# Patient Record
Sex: Female | Born: 1990 | Hispanic: Yes | Marital: Married | Smoking: Former smoker
Health system: Southern US, Community
[De-identification: ages and names within clinical notes are randomized; demographics above are authoritative.]

## PROBLEM LIST (undated history)

## (undated) DIAGNOSIS — Z789 Other specified health status: Secondary | ICD-10-CM

## (undated) HISTORY — DX: Other specified health status: Z78.9

## (undated) HISTORY — PX: NO PAST SURGERIES: SHX2092

---

## 2018-08-21 ENCOUNTER — Encounter (HOSPITAL_COMMUNITY): Payer: Self-pay

## 2018-08-21 ENCOUNTER — Other Ambulatory Visit: Payer: Self-pay

## 2018-08-21 ENCOUNTER — Emergency Department (HOSPITAL_COMMUNITY)
Admission: EM | Admit: 2018-08-21 | Discharge: 2018-08-21 | Disposition: A | Discharge status: Home / Self Care | Payer: BC Managed Care – PPO | Attending: Emergency Medicine | Admitting: Emergency Medicine

## 2018-08-21 DIAGNOSIS — N3001 Acute cystitis with hematuria: Secondary | ICD-10-CM | POA: Insufficient documentation

## 2018-08-21 DIAGNOSIS — R1032 Left lower quadrant pain: Secondary | ICD-10-CM | POA: Diagnosis not present

## 2018-08-21 DIAGNOSIS — R300 Dysuria: Secondary | ICD-10-CM | POA: Diagnosis not present

## 2018-08-21 DIAGNOSIS — R1030 Lower abdominal pain, unspecified: Secondary | ICD-10-CM | POA: Diagnosis not present

## 2018-08-21 DIAGNOSIS — R1031 Right lower quadrant pain: Secondary | ICD-10-CM | POA: Diagnosis not present

## 2018-08-21 DIAGNOSIS — R319 Hematuria, unspecified: Secondary | ICD-10-CM | POA: Diagnosis present

## 2018-08-21 LAB — COMPREHENSIVE METABOLIC PANEL
ALT: 22 U/L (ref 0–44)
AST: 35 U/L (ref 15–41)
Albumin: 3.8 g/dL (ref 3.5–5.0)
Alkaline Phosphatase: 80 U/L (ref 38–126)
Anion gap: 7 (ref 5–15)
BUN: 11 mg/dL (ref 6–20)
CO2: 26 mmol/L (ref 22–32)
Calcium: 9.2 mg/dL (ref 8.9–10.3)
Chloride: 106 mmol/L (ref 98–111)
Creatinine, Ser: 0.79 mg/dL (ref 0.44–1.00)
GFR calc Af Amer: >60 mL/min (ref >60)
GFR calc non Af Amer: >60 mL/min (ref >60)
Glucose, Bld: 95 mg/dL (ref 70–99)
Potassium: 4.4 mmol/L (ref 3.5–5.1)
Sodium: 139 mmol/L (ref 135–145)
Total Bilirubin: 0.6 mg/dL (ref 0.3–1.2)
Total Protein: 7.1 g/dL (ref 6.5–8.1)

## 2018-08-21 LAB — URINALYSIS, MICROSCOPIC (REFLEX)

## 2018-08-21 LAB — CBC WITH DIFFERENTIAL/PLATELET
Abs Immature Granulocytes: 0.04 10*3/uL (ref 0.00–0.07)
Basophils Absolute: 0.1 10*3/uL (ref 0.0–0.1)
Basophils Relative: 1 %
Eosinophils Absolute: 0.1 10*3/uL (ref 0.0–0.5)
Eosinophils Relative: 1 %
HCT: 39.2 % (ref 36.0–46.0)
Hemoglobin: 12.8 g/dL (ref 12.0–15.0)
Immature Granulocytes: 0 %
Lymphocytes Relative: 20 %
Lymphs Abs: 2.1 10*3/uL (ref 0.7–4.0)
MCH: 30.8 pg (ref 26.0–34.0)
MCHC: 32.7 g/dL (ref 30.0–36.0)
MCV: 94.5 fL (ref 80.0–100.0)
Monocytes Absolute: 0.7 10*3/uL (ref 0.1–1.0)
Monocytes Relative: 6 %
Neutro Abs: 7.8 10*3/uL — ABNORMAL HIGH (ref 1.7–7.7)
Neutrophils Relative %: 72 %
Platelets: 331 10*3/uL (ref 150–400)
RBC: 4.15 MIL/uL (ref 3.87–5.11)
RDW: 11.9 % (ref 11.5–15.5)
WBC: 10.8 10*3/uL — ABNORMAL HIGH (ref 4.0–10.5)
nRBC: 0 % (ref 0.0–0.2)

## 2018-08-21 LAB — URINALYSIS, ROUTINE W REFLEX MICROSCOPIC
Bilirubin Urine: NEGATIVE
Glucose, UA: NEGATIVE mg/dL
Ketones, ur: NEGATIVE mg/dL
Nitrite: POSITIVE — AB
Protein, ur: 30 mg/dL — AB
Specific Gravity, Urine: 1.015 (ref 1.005–1.030)
pH: 6 (ref 5.0–8.0)

## 2018-08-21 LAB — POC URINE PREG, ED: Preg Test, Ur: NEGATIVE

## 2018-08-21 LAB — LIPASE, BLOOD: Lipase: 32 U/L (ref 11–51)

## 2018-08-21 MED ORDER — SULFAMETHOXAZOLE-TRIMETHOPRIM 800-160 MG PO TABS: 1.0000 | ORAL_TABLET | Freq: Two times a day (BID) | ORAL | 0 refills | Status: AC

## 2018-08-21 MED ORDER — PHENAZOPYRIDINE HCL 200 MG PO TABS: 200.0000 mg | ORAL_TABLET | Freq: Three times a day (TID) | ORAL | 0 refills | Status: DC

## 2018-08-21 NOTE — ED Triage Notes (Signed)
Yesterday pt began having some painful urination, blood is present in her urine as well and having some Lt sided flank pain.

## 2018-08-21 NOTE — ED Notes (Signed)
Patient verbalizes understanding of discharge instructions. Opportunity for questioning and answers were provided. Armband removed by staff, pt discharged from ED.  

## 2018-08-21 NOTE — Discharge Instructions (Addendum)

## 2018-08-21 NOTE — ED Provider Notes (Signed)
[facility] EMERGENC[facility] DEPART[facility] Provider Note   CSN: [id number] Arrival date & time: 08/21/18  1004    History   Chief Complaint Chief Complaint  Patient presents with  . Hematuria    HPI Betty Scott is a 28 y.o. female presents emergency department today with chief complaint of hematuria x2 days.  Patient reports associated dysuria and abdominal pain.  Pain is located in suprapubic region and radiates to right and left lower quadrants.  She states the pain is sharp and rates it 7 out of 10 in severity.  Her husband used to work as a Software engineer and gave her an antibiotic for urinary tract infections.  She does not remember the name of the antibiotic.  She denies fever, chills, chest pain, shortness of breath, flank pain, nausea, vomiting, diarrhea.   Due to language barrier, a video interpreter was present during the history-taking and subsequent discussion (and for part of the physical exam) with this patient.   History reviewed. No pertinent past medical history.  There are no active problems to display for this patient.    OB History   No obstetric history on file.      Home Medications    Prior to Admission medications   Medication Sig Start Date End Date Taking? Authorizing Provider  phenazopyridine (PYRIDIUM) 200 MG tablet Take 1 tablet (200 mg total) by mouth 3 (three) times daily. 08/21/18   Albrizze, Kaitlyn E, PA-C  sulfamethoxazole-trimethoprim (BACTRIM DS) 800-160 MG tablet Take 1 tablet by mouth 2 (two) times daily for 14 days. 08/21/18 09/04/18  Albrizze, Harley Hallmark, PA-C    Family History History reviewed. No pertinent family history.  Social History Social History   Tobacco Use  . Smoking status: Not on file  Substance Use Topics  . Alcohol use: Not on file  . Drug use: Not on file     Allergies   Fish allergy and Fish-derived products   Review of Systems Review of Systems  Constitutional: Negative for chills and  fever.  Gastrointestinal: Positive for abdominal pain. Negative for diarrhea, nausea and vomiting.  Genitourinary: Positive for dysuria, flank pain and hematuria. Negative for pelvic pain, vaginal discharge and vaginal pain.  Skin: Negative for wound.  Allergic/Immunologic: Negative for immunocompromised state.     Physical Exam Updated Vital Signs BP 117/73 (BP Location: Right Arm)   Pulse 92   Temp 98.4 F (36.9 C) (Oral)   Resp 16   Ht 5\' 3"  (1.6 m)   Wt 65.8 kg   SpO2 98%   BMI 25.69 kg/m   Physical Exam Vitals signs and nursing note reviewed.  Constitutional:      General: She is not in acute distress.    Appearance: She is not ill-appearing.  HENT:     Head: Normocephalic and atraumatic.     Right Ear: Tympanic membrane and external ear normal.     Left Ear: Tympanic membrane and external ear normal.     Nose: Nose normal.     Mouth/Throat:     Mouth: Mucous membranes are moist.     Pharynx: Oropharynx is clear.  Eyes:     General: No scleral icterus.       Right eye: No discharge.        Left eye: No discharge.     Extraocular Movements: Extraocular movements intact.     Conjunctiva/sclera: Conjunctivae normal.     Pupils: Pupils are equal, round, and reactive to light.  Neck:  Musculoskeletal: Normal range of motion.     Vascular: No JVD.  Cardiovascular:     Rate and Rhythm: Normal rate and regular rhythm.     Pulses: Normal pulses.          Radial pulses are 2+ on the right side and 2+ on the left side.     Heart sounds: Normal heart sounds.  Pulmonary:     Comments: Lungs clear to auscultation in all fields. Symmetric chest rise. No wheezing, rales, or rhonchi. Abdominal:     Tenderness: There is right CVA tenderness. There is no left CVA tenderness.     Comments: Abdomen is soft, non-distended, Tender to palpation of suprapubic area. No rigidity, no guarding. No peritoneal signs.  Musculoskeletal: Normal range of motion.  Skin:    General: Skin  is warm and dry.     Capillary Refill: Capillary refill takes less than 2 seconds.  Neurological:     Mental Status: She is oriented to person, place, and time.     GCS: GCS eye subscore is 4. GCS verbal subscore is 5. GCS motor subscore is 6.     Comments: Fluent speech, no facial droop.  Psychiatric:        Behavior: Behavior normal.      ED Treatments / Results  Labs (all labs ordered are listed, but only abnormal results are displayed) Labs Reviewed  URINALYSIS, ROUTINE W REFLEX MICROSCOPIC - Abnormal; Notable for the following components:      Result Value   Color, Urine GREEN (*)    APPearance HAZY (*)    Hgb urine dipstick MODERATE (*)    Protein, ur 30 (*)    Nitrite POSITIVE (*)    Leukocytes,Ua MODERATE (*)    All other components within normal limits  CBC WITH DIFFERENTIAL/PLATELET - Abnormal; Notable for the following components:   WBC 10.8 (*)    Neutro Abs 7.8 (*)    All other components within normal limits  URINALYSIS, MICROSCOPIC (REFLEX) - Abnormal; Notable for the following components:   Bacteria, UA MANY (*)    All other components within normal limits  URINE CULTURE  COMPREHENSIVE METABOLIC PANEL  LIPASE, BLOOD  POC URINE PREG, ED    EKG None  Radiology No results found.  Procedures Procedures (including critical care time)  Medications Ordered in ED Medications - No data to display   Initial Impression / Assessment and Plan / ED Course  I have reviewed the triage vital signs and the nursing notes.  Pertinent labs & imaging results that were available during my care of the patient were reviewed by me and considered in my medical decision making (see chart for details).   Pt is afebrile, normotensive, overall well appearing.  She has suprapubic tenderness on exam, no peritoneal signs.  Also has mild left CVA tenderness. DDx includes UTI, pyelonephritis, STI, PID.  UA with signs of UTI. CBC with leukocytosis of 10.8, otherwise unremarkable.   CMP and lipase also unremarkable.  Pregnancy test is negative.  Have low suspicion for pyelonephritis given left CVA tenderness and slightly elevated WBC, so will discharge home with Bactrim x2 weeks for possible pyelonephritis. Also discharged with prescription for Pyridium for dysuria.  Evaluation does not show pathology that would require ongoing emergent intervention or inpatient treatment. I explained the diagnosis to the patient.   Patient is comfortable with above plan and is stable for discharge at this time. All questions were answered prior to disposition. Strict return precautions for  returning to the ED were discussed. Encouraged follow up with PCP. Provided patient with a list of clinic resources to use if they do not have a PCP.   This note was prepared using Dragon voice recognition software and may include unintentional dictation errors due to the inherent limitations of voice recognition software.    Final Clinical Impressions(s) / ED Diagnoses   Final diagnoses:  Acute cystitis with hematuria    ED Discharge Orders         Ordered    sulfamethoxazole-trimethoprim (BACTRIM DS) 800-160 MG tablet  2 times daily     08/21/18 1506    phenazopyridine (PYRIDIUM) 200 MG tablet  3 times daily     08/21/18 1506           Albrizze, Middleburg HeightsKaitlyn E, PA-C 08/21/18 1554    Derwood KaplanNanavati, Ankit, MD 08/22/18 1437

## 2018-08-23 LAB — URINE CULTURE: Culture: >=100000 — AB

## 2018-08-24 ENCOUNTER — Telehealth: Payer: Self-pay | Admitting: Emergency Medicine

## 2018-08-24 NOTE — Telephone Encounter (Signed)
Post ED Visit - Positive Culture Follow-up  Culture report reviewed by antimicrobial stewardship pharmacist: [facility] Team []  Elenor Quinones, Pharm.D. []  Heide Guile, Pharm.D., BCPS AQ-ID []  Parks Neptune, Pharm.D., BCPS []  Alycia Rossetti, Pharm.D., BCPS []  [place], Pharm.D., BCPS, AAHIVP []  Legrand Como, Pharm.D., BCPS, AAHIVP []  Salome Arnt, PharmD, BCPS []  Johnnette Gourd, PharmD, BCPS [x]  Hughes Better, PharmD, BCPS []  Leeroy Cha, PharmD []  Laqueta Linden, PharmD, BCPS []  Albertina Parr, PharmD  [facility] Team []  Leodis Sias, PharmD []  Lindell Spar, PharmD []  Royetta Asal, PharmD []  Graylin Shiver, Rph []  Rema Fendt) Glennon Mac, PharmD []  Arlyn Dunning, PharmD []  Netta Cedars, PharmD []  Dia Sitter, PharmD []  Leone Haven, PharmD []  Gretta Arab, PharmD []  Theodis Shove, PharmD []  Peggyann Juba, PharmD []  Reuel Boom, PharmD   Positive urine culture Treated with Sulfamethoxazole-Trimethoprim, organism sensitive to the same and no further patient follow-up is required at this time.  Betty Scott Betty Scott 08/24/2018, 4:16 PM

## 2019-05-14 ENCOUNTER — Emergency Department (HOSPITAL_COMMUNITY): Payer: BC Managed Care – PPO

## 2019-05-14 ENCOUNTER — Emergency Department (HOSPITAL_COMMUNITY)
Admission: EM | Admit: 2019-05-14 | Discharge: 2019-05-14 | Disposition: A | Discharge status: Home / Self Care | Payer: BC Managed Care – PPO | Attending: Emergency Medicine | Admitting: Emergency Medicine

## 2019-05-14 ENCOUNTER — Other Ambulatory Visit: Payer: Self-pay

## 2019-05-14 DIAGNOSIS — R112 Nausea with vomiting, unspecified: Secondary | ICD-10-CM | POA: Insufficient documentation

## 2019-05-14 DIAGNOSIS — R109 Unspecified abdominal pain: Secondary | ICD-10-CM | POA: Diagnosis present

## 2019-05-14 DIAGNOSIS — R1011 Right upper quadrant pain: Secondary | ICD-10-CM | POA: Diagnosis not present

## 2019-05-14 DIAGNOSIS — R1013 Epigastric pain: Secondary | ICD-10-CM | POA: Diagnosis not present

## 2019-05-14 LAB — COMPREHENSIVE METABOLIC PANEL
ALT: 17 U/L (ref 0–44)
AST: 24 U/L (ref 15–41)
Albumin: 3.8 g/dL (ref 3.5–5.0)
Alkaline Phosphatase: 87 U/L (ref 38–126)
Anion gap: 9 (ref 5–15)
BUN: 12 mg/dL (ref 6–20)
CO2: 26 mmol/L (ref 22–32)
Calcium: 9 mg/dL (ref 8.9–10.3)
Chloride: 104 mmol/L (ref 98–111)
Creatinine, Ser: 0.85 mg/dL (ref 0.44–1.00)
GFR calc Af Amer: >60 mL/min (ref >60)
GFR calc non Af Amer: >60 mL/min (ref >60)
Glucose, Bld: 109 mg/dL — ABNORMAL HIGH (ref 70–99)
Potassium: 3.9 mmol/L (ref 3.5–5.1)
Sodium: 139 mmol/L (ref 135–145)
Total Bilirubin: 0.9 mg/dL (ref 0.3–1.2)
Total Protein: 7.7 g/dL (ref 6.5–8.1)

## 2019-05-14 LAB — URINALYSIS, ROUTINE W REFLEX MICROSCOPIC
Bacteria, UA: NONE SEEN
Bilirubin Urine: NEGATIVE
Glucose, UA: NEGATIVE mg/dL
Hgb urine dipstick: NEGATIVE
Ketones, ur: 20 mg/dL — AB
Leukocytes,Ua: NEGATIVE
Nitrite: NEGATIVE
Protein, ur: 30 mg/dL — AB
Specific Gravity, Urine: 1.03 (ref 1.005–1.030)
pH: 6 (ref 5.0–8.0)

## 2019-05-14 LAB — CBC
HCT: 40.3 % (ref 36.0–46.0)
Hemoglobin: 12.8 g/dL (ref 12.0–15.0)
MCH: 29.8 pg (ref 26.0–34.0)
MCHC: 31.8 g/dL (ref 30.0–36.0)
MCV: 93.7 fL (ref 80.0–100.0)
Platelets: 378 10*3/uL (ref 150–400)
RBC: 4.3 MIL/uL (ref 3.87–5.11)
RDW: 12.4 % (ref 11.5–15.5)
WBC: 8.6 10*3/uL (ref 4.0–10.5)
nRBC: 0 % (ref 0.0–0.2)

## 2019-05-14 LAB — I-STAT BETA HCG BLOOD, ED (MC, WL, AP ONLY): I-stat hCG, quantitative: <5 m[IU]/mL (ref <5)

## 2019-05-14 LAB — LIPASE, BLOOD: Lipase: 25 U/L (ref 11–51)

## 2019-05-14 MED ORDER — ONDANSETRON HCL 4 MG PO TABS: 4.0000 mg | ORAL_TABLET | Freq: Four times a day (QID) | ORAL | 0 refills | Status: DC

## 2019-05-14 MED ORDER — SODIUM CHLORIDE 0.9% FLUSH
3.0000 mL | Freq: Once | INTRAVENOUS | Status: AC
  Administered 2019-05-14: 3 mL via INTRAVENOUS

## 2019-05-14 MED ORDER — ONDANSETRON HCL 4 MG/2ML IJ SOLN
4.0000 mg | Freq: Once | INTRAMUSCULAR | Status: AC
  Administered 2019-05-14: 4 mg via INTRAVENOUS
  Filled 2019-05-14: qty 2

## 2019-05-14 MED ORDER — SODIUM CHLORIDE 0.9 % IV BOLUS (SEPSIS)
1000.0000 mL | Freq: Once | INTRAVENOUS | Status: AC
  Administered 2019-05-14: 1000 mL via INTRAVENOUS

## 2019-05-14 NOTE — ED Triage Notes (Signed)
Sent from urgent care for abd pain-- has been throwing up recently- 10 times yesterday green in color. No fever. No diarrhea.

## 2019-05-14 NOTE — ED Provider Notes (Signed)
[facility] EMERGENCY DEPARTMENT Provider Note   CSN: [id number] Arrival date & time: 05/14/19  1513     History Chief Complaint  Patient presents with  . Abdominal Pain    Betty Scott is a 29 y.o. female.  Patient is a 29 year old female with no past medical history presenting to the emergency department for epigastric abdominal pain.  Patient reports that it started yesterday and she has had multiple episodes of nausea, vomiting and is having anorexia.  She saw urgent care today who sent her here for further evaluation.  Patient reports the pain is mild.  She has not had anything to eat since yesterday.  Denies any dysuria or diarrhea or fever or chills or cough or URI symptoms.  the pain is exacerbated by eating        No past medical history on file.  There are no problems to display for this patient.      OB History   No obstetric history on file.     No family history on file.  Social History   Tobacco Use  . Smoking status: Not on file  Substance Use Topics  . Alcohol use: Not on file  . Drug use: Not on file    Home Medications Prior to Admission medications   Not on File    Allergies    Patient has no allergy information on record.  Review of Systems   Review of Systems  Constitutional: Positive for appetite change. Negative for chills and fever.  HENT: Negative for congestion and sore throat.   Eyes: Negative for visual disturbance.  Respiratory: Negative for cough and shortness of breath.   Cardiovascular: Negative for chest pain.  Gastrointestinal: Positive for abdominal pain, nausea and vomiting. Negative for abdominal distention and diarrhea.  Genitourinary: Negative for dysuria.  Musculoskeletal: Negative for back pain.  Skin: Negative for rash.  Allergic/Immunologic: Negative for immunocompromised state.  Neurological: Negative for dizziness.    Physical Exam Updated Vital Signs BP 108/77 (BP Location:  Left Arm)   Pulse 85   Temp 98.5 F (36.9 C) (Oral)   Resp 16   LMP 05/13/2019   SpO2 100%   Physical Exam Vitals and nursing note reviewed.  Constitutional:      General: She is not in acute distress.    Appearance: Normal appearance. She is well-developed. She is not ill-appearing, toxic-appearing or diaphoretic.  HENT:     Head: Normocephalic.     Mouth/Throat:     Mouth: Mucous membranes are moist.  Eyes:     Conjunctiva/sclera: Conjunctivae normal.  Cardiovascular:     Rate and Rhythm: Normal rate and regular rhythm.  Pulmonary:     Effort: Pulmonary effort is normal.  Abdominal:     General: Abdomen is flat. Bowel sounds are normal.     Palpations: Abdomen is soft. There is no shifting dullness.     Tenderness: There is abdominal tenderness in the epigastric area. There is no right CVA tenderness, guarding or rebound. Negative signs include Murphy's sign, Rovsing's sign, McBurney's sign and psoas sign.  Skin:    General: Skin is dry.  Neurological:     Mental Status: She is alert.  Psychiatric:        Mood and Affect: Mood normal.     ED Results / Procedures / Treatments   Labs (all labs ordered are listed, but only abnormal results are displayed) Labs Reviewed  COMPREHENSIVE METABOLIC PANEL - Abnormal; Notable for the following  components:      Result Value   Glucose, Bld 109 (*)    All other components within normal limits  LIPASE, BLOOD  CBC  URINALYSIS, ROUTINE W REFLEX MICROSCOPIC  I-STAT BETA HCG BLOOD, ED ([facility], [facility], [facility] ONLY)    EKG None  Radiology No results found.  Procedures Procedures (including critical care time)  Medications Ordered in ED Medications  sodium chloride flush (NS) 0.9 % injection 3 mL (has no administration in time range)  sodium chloride 0.9 % bolus 1,000 mL (has no administration in time range)  ondansetron (ZOFRAN) injection 4 mg (has no administration in time range)    ED Course  I have reviewed the triage vital  signs and the nursing notes.  Pertinent labs & imaging results that were available during my care of the patient were reviewed by me and considered in my medical decision making (see chart for details).  Clinical Course as of May 14 2131  Wed May 14, 2019  2005 Well-appearing female presenting to the emergency department sent by urgent care for for further evaluation of epigastric pain.  Vitals and labs are reassuring.  A right upper quadrant ultrasound was performed.   [KM]  2117 Right upper quadrant ultrasound was reassuring.  There is a gallbladder polyp.  This was explained using the interpreter with the patient.  It is advised that she have a follow-up ultrasound in 5 months if she is still having symptoms.  She is otherwise feeling well and stable for discharge.  Likely viral etiology of her symptoms.  She will be sent home with Zofran.   [KM]    Clinical Course User Index [KM] Kristine Royal   MDM Rules/Calculators/A&P                      Based on review of vitals, medical screening exam, lab work and/or imaging, there does not appear to be an acute, emergent etiology for the patient's symptoms. Counseled pt on good return precautions and encouraged both PCP and ED follow-up as needed.  Prior to discharge, I also discussed incidental imaging findings with patient in detail and advised appropriate, recommended follow-up in detail.  Clinical Impression: 1. Epigastric pain   2. RUQ pain     Disposition: Discharge  Prior to providing a prescription for a controlled substance, I independently reviewed the patient's recent prescription history on the [place]. The patient had no recent or regular prescriptions and was deemed appropriate for a brief, less than 3 day prescription of narcotic for acute analgesia.  This note was prepared with assistance of Systems analyst. Occasional wrong-word or sound-a-like  substitutions may have occurred due to the inherent limitations of voice recognition software.  Final Clinical Impression(s) / ED Diagnoses Final diagnoses:  RUQ pain    Rx / DC Orders ED Discharge Orders    None       Kristine Royal 05/14/19 2133    Daleen Bo, MD 05/14/19 2337

## 2019-05-14 NOTE — ED Notes (Signed)
Kelly PA at bedside   

## 2019-05-14 NOTE — Discharge Instructions (Signed)
You are seen today for abdominal pain.  Your work-up is reassuring.  There was a small gallbladder polyp on your ultrasound.  It is recommended that if you are still having some stomach issues and to follow-up with your primary care doctor and have this ultrasound repeated in 5 months.  You think you may have a viral infection.  Please stay home until you are feeling well and drink plenty of fluids.

## 2020-05-18 ENCOUNTER — Other Ambulatory Visit: Payer: Self-pay

## 2020-05-18 ENCOUNTER — Encounter (HOSPITAL_COMMUNITY): Payer: Self-pay | Admitting: Emergency Medicine

## 2020-05-18 ENCOUNTER — Emergency Department (HOSPITAL_COMMUNITY)
Admission: EM | Admit: 2020-05-18 | Discharge: 2020-05-19 | Discharge status: Against Medical Advice | Payer: BC Managed Care – PPO | Attending: Emergency Medicine | Admitting: Emergency Medicine

## 2020-05-18 DIAGNOSIS — Z5321 Procedure and treatment not carried out due to patient leaving prior to being seen by health care provider: Secondary | ICD-10-CM | POA: Insufficient documentation

## 2020-05-18 DIAGNOSIS — J00 Acute nasopharyngitis [common cold]: Secondary | ICD-10-CM | POA: Insufficient documentation

## 2020-05-18 DIAGNOSIS — R519 Headache, unspecified: Secondary | ICD-10-CM | POA: Insufficient documentation

## 2020-05-18 DIAGNOSIS — Z20822 Contact with and (suspected) exposure to covid-19: Secondary | ICD-10-CM | POA: Insufficient documentation

## 2020-05-18 LAB — RESP PANEL BY RT-PCR (FLU A&B, COVID) ARPGX2
Influenza A by PCR: NEGATIVE
Influenza B by PCR: NEGATIVE
SARS Coronavirus 2 by RT PCR: NEGATIVE

## 2020-05-18 NOTE — ED Triage Notes (Signed)
Emergency Medicine Provider Triage Evaluation Note  Genessis Flanary , a 30 y.o. female  was evaluated in triage.  Pt complains of cold sxs.  Review of Systems  Positive: Myalgias, cough, sob, cp Negative: Fever, headache, n/v/d  Physical Exam  BP 104/76 (BP Location: Right Arm)   Pulse 93   Temp 98.7 F (37.1 C) (Oral)   Resp 16   SpO2 99%  Gen:   Awake, no distress   HEENT:  Atraumatic  Resp:  Normal effort  Cardiac:  Normal rate  Abd:   Nondistended, nontender  MSK:   Moves extremities without difficulty  Neuro:  Speech clear   Medical Decision Making  Medically screening exam initiated at 2:12 PM.  Appropriate orders placed.  Rikia Knighton was informed that the remainder of the evaluation will be completed by another provider, this initial triage assessment does not replace that evaluation, and the importance of remaining in the ED until their evaluation is complete.  Clinical Impression  Pt has been vaccinated for covid-19 here with cold sxs.  Well appearing, no acute resp discomfort.    Fayrene Helper, PA-C 05/18/20 1413

## 2020-05-18 NOTE — ED Notes (Signed)
Pt asked could she go outside for a few minuets through a interpreter on her phone. Pt not answering to be roomed.

## 2020-05-18 NOTE — ED Triage Notes (Addendum)
Pt coming from home, compliant of generalized body aches that started last time. Pt states this has happened before but unsure of cause.

## 2020-08-04 ENCOUNTER — Encounter: Payer: Self-pay | Admitting: Emergency Medicine

## 2020-08-04 ENCOUNTER — Ambulatory Visit
Admission: EM | Admit: 2020-08-04 | Discharge: 2020-08-04 | Disposition: A | Discharge status: Home / Self Care | Payer: Self-pay

## 2020-08-04 DIAGNOSIS — R0789 Other chest pain: Secondary | ICD-10-CM

## 2020-08-04 MED ORDER — NAPROXEN 500 MG PO TABS: 500.0000 mg | ORAL_TABLET | Freq: Two times a day (BID) | ORAL | 0 refills | Status: DC

## 2020-08-04 MED ORDER — TIZANIDINE HCL 4 MG PO TABS: 4.0000 mg | ORAL_TABLET | Freq: Four times a day (QID) | ORAL | 0 refills | Status: DC | PRN

## 2020-08-04 NOTE — ED Provider Notes (Signed)
[facility]   MRN: [id number] DOB: [birth year]  Subjective:   Betty Lepkowski is a 30 y.o. female presenting for 3-day history of acute onset mid to right-sided chest pain.  Patient states that the symptoms occur randomly and are moderate to severe nature.  Denies any heart racing, palpitations, shortness of breath, wheezing, nausea, vomiting, abdominal pain, history of heart disorders.  Patient also has intermittent mild headaches, has a history of migraines.  She is actually undergoing a lot of stress with her husband and reports that she has been very anxious lately.  She is wants to make sure that her heart is okay.  Does not believe that she has COVID-19 test.  No history of thyroid disorders, endocrine disorders.  No current facility-administered medications for this encounter.  Current Outpatient Medications:    acetaminophen (TYLENOL) 500 MG tablet, Take 500 mg by mouth every 6 (six) hours as needed., Disp: , Rfl:    ondansetron (ZOFRAN) 4 MG tablet, Take 1 tablet (4 mg total) by mouth every 6 (six) hours., Disp: 12 tablet, Rfl: 0   Allergies  Allergen Reactions   Fish Allergy     Rash     History reviewed. No pertinent past medical history.   History reviewed. No pertinent surgical history.  History reviewed. No pertinent family history.  Social History   Tobacco Use   Smoking status: Some Days    Pack years: 0.00    Types: Cigarettes   Smokeless tobacco: Never  Substance Use Topics   Alcohol use: Never   Drug use: Never    ROS   Objective:   Vitals: BP 108/76 (BP Location: Right Arm)   Pulse (!) 101   Temp 98.6 F (37 C) (Oral)   Resp 14   LMP 07/19/2020 (Exact Date)   SpO2 97%   Physical Exam Constitutional:      General: She is not in acute distress.    Appearance: Normal appearance. She is well-developed. She is not ill-appearing, toxic-appearing or diaphoretic.  HENT:     Head: Normocephalic and atraumatic.     Nose: Nose  normal.     Mouth/Throat:     Mouth: Mucous membranes are moist.  Eyes:     Extraocular Movements: Extraocular movements intact.     Pupils: Pupils are equal, round, and reactive to light.  Cardiovascular:     Rate and Rhythm: Normal rate and regular rhythm.     Pulses: Normal pulses.     Heart sounds: Normal heart sounds. No murmur heard.   No friction rub. No gallop.  Pulmonary:     Effort: Pulmonary effort is normal. No respiratory distress.     Breath sounds: Normal breath sounds. No stridor. No wheezing, rhonchi or rales.     Comments: No bony defect or pectus excavatum.  Chest:     Chest wall: Tenderness (mild over mid-upper sternum) present.  Skin:    General: Skin is warm and dry.     Findings: No rash.  Neurological:     Mental Status: She is alert and oriented to person, place, and time.  Psychiatric:        Mood and Affect: Mood normal.        Behavior: Behavior normal.        Thought Content: Thought content normal.    ED ECG REPORT   Date: 08/04/2020  EKG Time: 5:07 PM  Rate: 79bpm  Rhythm: normal sinus rhythm,  normal EKG, normal sinus rhythm, there are no  previous tracings available for comparison  Axis: Normal  Intervals:none  ST&T Change: None  Narrative Interpretation: Sinus rhythm at 79 bpm, no previous EKG available for comparison.  Assessment and Plan :   PDMP not reviewed this encounter.  1. Atypical chest pain     Recommended management for musculoskeletal chest wall pain of unknown etiology.  Use naproxen, tizanidine.  Counseled on general management including limiting her lifting and stressors as it is also possible that this chest pain is anxiety related.  Recommended establishing care and following up with a new PCP, information provided to her for this. Counseled patient on potential for adverse effects with medications prescribed/recommended today, ER and return-to-clinic precautions discussed, patient verbalized understanding.    Wallis Bamberg, PA-C 08/04/20 1709

## 2020-08-04 NOTE — ED Triage Notes (Signed)
Central/right sided chest pain starting three days prior. Denies dizziness, coughing, sore throat, nausea, vomiting. Also c/o headache, but states she has a history of migraines

## 2020-10-08 ENCOUNTER — Ambulatory Visit: Payer: Self-pay | Admitting: *Deleted

## 2020-10-08 NOTE — Telephone Encounter (Signed)
Returned call to patient using Spanish interpreter [employer] ID# [id number]. Patient calling with complaints of a migraine. Patient rates pain at 8 and denies other symptoms at this time. Patient states she has a history of migraines and has taken acetaminophen and naprosyn today with not relief. Patient does not have a PCP currently. Patient advised to be seen at [facility] or ED for current symptoms. Patient verbalized understanding and states she will have someone to drive her to seek treatment.  Reason for Disposition  [1] SEVERE headache (e.g., excruciating) AND [2] not improved after 2 hours of pain medicine  Answer Assessment - Initial Assessment Questions 1. LOCATION: "Where does it hurt?"      The whole head 2. ONSET: "When did the headache start?" (Minutes, hours or days)      yesterday 3. PATTERN: "Does the pain come and go, or has it been constant since it started?"    constant 4. SEVERITY: "How bad is the pain?" and "What does it keep you from doing?"  (e.g., Scale 1-10; mild, moderate, or severe)   - MILD (1-3): doesn't interfere with normal activities    - MODERATE (4-7): interferes with normal activities or awakens from sleep    - SEVERE (8-10): excruciating pain, unable to do any normal activities        8 5. RECURRENT SYMPTOM: "Have you ever had headaches before?" If Yes, ask: "When was the last time?" and "What happened that time?"      Patient states she had a history of migraines 6. CAUSE: "What do you think is causing the headache?"     Patient states she has a history of migraines 7. MIGRAINE: "Have you been diagnosed with migraine headaches?" If Yes, ask: "Is this headache similar?"      Yes. At 7 am patient states that she took a pill,Acetaminophen. She states that she did not call before because she did not have a plan 8. HEAD INJURY: "Has there been any recent injury to the head?"      No 9. OTHER SYMPTOMS: "Do you have any other symptoms?" (fever, stiff neck, eye pain, sore throat,  cold symptoms)     No 10. PREGNANCY: "Is there any chance you are pregnant?" "When was your last menstrual period?"      N/a  Protocols used: Headache-A-AH

## 2020-12-09 ENCOUNTER — Emergency Department (HOSPITAL_COMMUNITY)
Admission: EM | Admit: 2020-12-09 | Discharge: 2020-12-10 | Disposition: A | Discharge status: Home / Self Care | Payer: Medicaid Other | Attending: Emergency Medicine | Admitting: Emergency Medicine

## 2020-12-09 ENCOUNTER — Other Ambulatory Visit: Payer: Self-pay

## 2020-12-09 ENCOUNTER — Emergency Department (HOSPITAL_COMMUNITY): Payer: Medicaid Other

## 2020-12-09 ENCOUNTER — Encounter (HOSPITAL_COMMUNITY): Payer: Self-pay | Admitting: Oncology

## 2020-12-09 DIAGNOSIS — Z3A01 Less than 8 weeks gestation of pregnancy: Secondary | ICD-10-CM

## 2020-12-09 DIAGNOSIS — O99355 Diseases of the nervous system complicating the puerperium: Secondary | ICD-10-CM | POA: Insufficient documentation

## 2020-12-09 DIAGNOSIS — R102 Pelvic and perineal pain: Secondary | ICD-10-CM | POA: Insufficient documentation

## 2020-12-09 DIAGNOSIS — G43909 Migraine, unspecified, not intractable, without status migrainosus: Secondary | ICD-10-CM | POA: Insufficient documentation

## 2020-12-09 DIAGNOSIS — O99331 Smoking (tobacco) complicating pregnancy, first trimester: Secondary | ICD-10-CM | POA: Diagnosis not present

## 2020-12-09 DIAGNOSIS — F17210 Nicotine dependence, cigarettes, uncomplicated: Secondary | ICD-10-CM | POA: Insufficient documentation

## 2020-12-09 LAB — BASIC METABOLIC PANEL
Anion gap: 6 (ref 5–15)
BUN: 14 mg/dL (ref 6–20)
CO2: 26 mmol/L (ref 22–32)
Calcium: 9.1 mg/dL (ref 8.9–10.3)
Chloride: 105 mmol/L (ref 98–111)
Creatinine, Ser: 0.76 mg/dL (ref 0.44–1.00)
GFR, Estimated: >60 mL/min (ref >60)
Glucose, Bld: 100 mg/dL — ABNORMAL HIGH (ref 70–99)
Potassium: 3.8 mmol/L (ref 3.5–5.1)
Sodium: 137 mmol/L (ref 135–145)

## 2020-12-09 LAB — CBC WITH DIFFERENTIAL/PLATELET
Abs Immature Granulocytes: 0.05 10*3/uL (ref 0.00–0.07)
Basophils Absolute: 0.1 10*3/uL (ref 0.0–0.1)
Basophils Relative: 0 %
Eosinophils Absolute: 0.1 10*3/uL (ref 0.0–0.5)
Eosinophils Relative: 1 %
HCT: 37.7 % (ref 36.0–46.0)
Hemoglobin: 12.4 g/dL (ref 12.0–15.0)
Immature Granulocytes: 0 %
Lymphocytes Relative: 17 %
Lymphs Abs: 2.1 10*3/uL (ref 0.7–4.0)
MCH: 31 pg (ref 26.0–34.0)
MCHC: 32.9 g/dL (ref 30.0–36.0)
MCV: 94.3 fL (ref 80.0–100.0)
Monocytes Absolute: 0.8 10*3/uL (ref 0.1–1.0)
Monocytes Relative: 6 %
Neutro Abs: 9.9 10*3/uL — ABNORMAL HIGH (ref 1.7–7.7)
Neutrophils Relative %: 76 %
Platelets: 397 10*3/uL (ref 150–400)
RBC: 4 MIL/uL (ref 3.87–5.11)
RDW: 12.3 % (ref 11.5–15.5)
WBC: 13 10*3/uL — ABNORMAL HIGH (ref 4.0–10.5)
nRBC: 0 % (ref 0.0–0.2)

## 2020-12-09 LAB — I-STAT BETA HCG BLOOD, ED (MC, WL, AP ONLY): I-stat hCG, quantitative: 1220.5 m[IU]/mL — ABNORMAL HIGH (ref <5)

## 2020-12-09 LAB — HCG, QUANTITATIVE, PREGNANCY: hCG, Beta Chain, Quant, S: 1970 m[IU]/mL — ABNORMAL HIGH (ref <5)

## 2020-12-09 NOTE — ED Provider Notes (Signed)
Emergency Medicine Provider Triage Evaluation Note  Betty Scott , a 30 y.o. female  was evaluated in triage.  Pt complains of headache and possible pregnancy.  Patient reports her last menstrual cycle was a month ago when she has had 2 positive home pregnancy tests.  She reports that she has been having some intermittent left lower quadrant abdominal pain that has been worsening today but denies vaginal bleeding.  No nausea or vomiting.  Reports that she has a history of migraines, and has been having frequent headaches over the past 5 years, today has been having persistent headache over the past 3 days with associated photophobia and she reports that Tylenol is not helping with the symptoms.  No associated numbness or weakness.  Feels exactly like her prior headaches that she has also had outside of pregnancy.  Review of Systems  Positive: Headache, photophobia, lower abdominal pain Negative: Fever, vomiting, numbness, weakness  Physical Exam  BP 120/81 (BP Location: Left Arm)   Pulse 92   Temp 98.4 F (36.9 C) (Oral)   Resp 17   Ht 5\' 4"  (1.626 m)   Wt 56.7 kg   LMP 09/26/2020 (Exact Date)   SpO2 100%   BMI 21.46 kg/m  Gen:   Awake, no distress   Resp:  Normal effort  MSK:   Moves extremities without difficulty  Other:  Left lower quadrant tenderness  Medical Decision Making  Medically screening exam initiated at 6:25 PM.  Appropriate orders placed.  Betty Scott was informed that the remainder of the evaluation will be completed by another provider, this initial triage assessment does not replace that evaluation, and the importance of remaining in the ED until their evaluation is complete.  Headache without red flag symptoms, similar to headache she has had outside of pregnancy lower suspicion for venous sinus thrombosis since this feels like her typical headaches.  No focal neurologic deficits.  Wanting to confirm pregnancy but has also been having persistent and  worsening left lower quadrant abdominal pain without vaginal bleeding.  We will get lab work and ultrasound to rule out ectopic   11/26/2020 12/09/20 1827    12/11/20, MD 12/10/20 [phone]

## 2020-12-09 NOTE — ED Triage Notes (Signed)
Pt presents d/t migraine x 3 days.  +photophobia.  Pt also reports no menstrual cycle x 1 month.  2 positive pregnancy tests at home.

## 2020-12-10 MED ORDER — ACETAMINOPHEN 500 MG PO TABS
1000.0000 mg | ORAL_TABLET | Freq: Once | ORAL | Status: AC
  Administered 2020-12-10: 1000 mg via ORAL
  Filled 2020-12-10: qty 2

## 2020-12-10 MED ORDER — DIPHENHYDRAMINE HCL 25 MG PO CAPS
25.0000 mg | ORAL_CAPSULE | Freq: Once | ORAL | Status: AC
  Administered 2020-12-10: 25 mg via ORAL
  Filled 2020-12-10: qty 1

## 2020-12-10 MED ORDER — METOCLOPRAMIDE HCL 5 MG/ML IJ SOLN
10.0000 mg | Freq: Once | INTRAMUSCULAR | Status: AC
  Administered 2020-12-10: 10 mg via INTRAMUSCULAR
  Filled 2020-12-10: qty 2

## 2020-12-10 NOTE — ED Provider Notes (Signed)
[facility]-EMERGENCY DEPT Provider Note   CSN: [id number] Arrival date & time: 12/09/20  1727     History Chief Complaint  Patient presents with   Migraine    Betty Scott is a 30 y.o. female.  Patient presents to the emergency department with multiple complaints.  Patient reports that she has a history of frequent migraines.  She has had them for many years.  Patient has been having a headache with light sensitivity for the last 5 days.  She normally uses a medication for her headaches that she got when she was living in [place]  She is not sure what the medicine is.  She has not tried medicine for this particular headache.  Patient reports that this headache is identical to multiple migraine she has had in the past.  There are no unusual features that would suggest a different type of headache.  She does not have any unilateral symptoms, speech difficulty, neck stiffness, fever.  Patient reports that she has been having some intermittent cramping in the left lower abdomen and pelvic region.  No vaginal bleeding or discharge.  Patient reports that her last menstrual period was September 4, has had 2 home pregnancy tests that are positive since then.      History reviewed. No pertinent past medical history.  There are no problems to display for this patient.   History reviewed. No pertinent surgical history.   OB History     Gravida  1   Para      Term      Preterm      AB      Living         SAB      IAB      Ectopic      Multiple      Live Births              No family history on file.  Social History   Tobacco Use   Smoking status: Some Days    Types: Cigarettes   Smokeless tobacco: Never  Substance Use Topics   Alcohol use: Never   Drug use: Never    Home Medications Prior to Admission medications   Medication Sig Start Date End Date Taking? Authorizing Provider  acetaminophen (TYLENOL) 500 MG tablet  Take 500 mg by mouth every 6 (six) hours as needed.    [provider]  naproxen (NAPROSYN) 500 MG tablet Take 1 tablet (500 mg total) by mouth 2 (two) times daily with a meal. 08/04/20   Wallis Bamberg, PA-C  ondansetron (ZOFRAN) 4 MG tablet Take 1 tablet (4 mg total) by mouth every 6 (six) hours. 05/14/19   Arlyn Dunning, PA-C  tiZANidine (ZANAFLEX) 4 MG tablet Take 1 tablet (4 mg total) by mouth every 6 (six) hours as needed for muscle spasms. 08/04/20   Wallis Bamberg, PA-C    Allergies    Fish allergy  Review of Systems   Review of Systems  Genitourinary:  Positive for pelvic pain. Negative for dysuria.  Neurological:  Positive for headaches.  All other systems reviewed and are negative.  Physical Exam Updated Vital Signs BP 110/68 (BP Location: Right Arm)   Pulse 82   Temp 98.6 F (37 C) (Oral)   Resp 17   Ht 5\' 4"  (1.626 m)   Wt 56.7 kg   LMP 09/26/2020 (Exact Date)   SpO2 99%   BMI 21.46 kg/m   Physical Exam Vitals and  nursing note reviewed.  Constitutional:      General: She is not in acute distress.    Appearance: Normal appearance. She is well-developed.  HENT:     Head: Normocephalic and atraumatic.     Right Ear: Hearing normal.     Left Ear: Hearing normal.     Nose: Nose normal.  Eyes:     Conjunctiva/sclera: Conjunctivae normal.     Pupils: Pupils are equal, round, and reactive to light.  Cardiovascular:     Rate and Rhythm: Regular rhythm.     Heart sounds: S1 normal and S2 normal. No murmur heard.   No friction rub. No gallop.  Pulmonary:     Effort: Pulmonary effort is normal. No respiratory distress.     Breath sounds: Normal breath sounds.  Chest:     Chest wall: No tenderness.  Abdominal:     General: Bowel sounds are normal.     Palpations: Abdomen is soft.     Tenderness: There is no abdominal tenderness. There is no guarding or rebound. Negative signs include Murphy's sign and McBurney's sign.     Hernia: No hernia is present.   Musculoskeletal:        General: Normal range of motion.     Cervical back: Normal range of motion and neck supple.  Skin:    General: Skin is warm and dry.     Findings: No rash.  Neurological:     Mental Status: She is alert and oriented to person, place, and time.     GCS: GCS eye subscore is 4. GCS verbal subscore is 5. GCS motor subscore is 6.     Cranial Nerves: No cranial nerve deficit.     Sensory: No sensory deficit.     Coordination: Coordination normal.  Psychiatric:        Speech: Speech normal.        Behavior: Behavior normal.        Thought Content: Thought content normal.    ED Results / Procedures / Treatments   Labs (all labs ordered are listed, but only abnormal results are displayed) Labs Reviewed  CBC WITH DIFFERENTIAL/PLATELET - Abnormal; Notable for the following components:      Result Value   WBC 13.0 (*)    Neutro Abs 9.9 (*)    All other components within normal limits  BASIC METABOLIC PANEL - Abnormal; Notable for the following components:   Glucose, Bld 100 (*)    All other components within normal limits  HCG, QUANTITATIVE, PREGNANCY - Abnormal; Notable for the following components:   hCG, Beta Chain, Quant, S 1,970 (*)    All other components within normal limits  I-STAT BETA HCG BLOOD, ED ([facility], [facility], [facility] ONLY) - Abnormal; Notable for the following components:   I-stat hCG, quantitative 1,220.5 (*)    All other components within normal limits    EKG None  Radiology US OB Comp < 14 Wks  Result Date: 12/09/2020 CLINICAL DATA:  Pelvic pain, pregnancy EXAM: OBSTETRIC <14 WK [place] AND TRANSVAGINAL OB US TECHNIQUE: Both transabdominal and transvaginal ultrasound examinations were performed for complete evaluation of the gestation as well as the maternal uterus, adnexal regions, and pelvic cul-de-sac. Transvaginal technique was performed to assess early pregnancy. COMPARISON:  None. FINDINGS: Intrauterine gestational sac: Present, single Yolk sac:  Not  identified Embryo:  Not identified Cardiac Activity: Not applicable MSD: 3 mm   5 w   0 d CRL: Not applicable Subchorionic hemorrhage:  None  visualized. Maternal uterus/adnexae: The uterus is retroverted. No intrauterine masses are seen. A corpus luteum is identified within the right ovary which is otherwise unremarkable. The left ovary contains multiple follicles and is unremarkable. Separate from the left ovary is a thin walled cystic structure within the left adnexa measuring 14 mm in diameter. This simple cyst demonstrates no internal architecture or mural nodularity. There is, however, small simple appearing free fluid within the left adnexa and within the cul-de-sac. IMPRESSION: Intrauterine gestational sac identified. Nonvisualization of a yolk sac and fetal pole roughly estimates the gestation between 5.0 and 5.5 weeks. Indeterminate cystic structure within the left adnexa with surrounding small free fluid within the pelvis. A heterotopic gestation is not excluded. Correlation with serial beta HCG and short-term imaging follow-up in 5-7 days would be helpful to differentiate a stable peritoneal inclusion cyst from a extra uterine gestational sac. Electronically Signed   By: Helyn Numbers M.D.   On: 12/09/2020 20:15   US OB Transvaginal  Result Date: 12/09/2020 CLINICAL DATA:  Pelvic pain, pregnancy EXAM: OBSTETRIC <14 WK [place] AND TRANSVAGINAL OB US TECHNIQUE: Both transabdominal and transvaginal ultrasound examinations were performed for complete evaluation of the gestation as well as the maternal uterus, adnexal regions, and pelvic cul-de-sac. Transvaginal technique was performed to assess early pregnancy. COMPARISON:  None. FINDINGS: Intrauterine gestational sac: Present, single Yolk sac:  Not identified Embryo:  Not identified Cardiac Activity: Not applicable MSD: 3 mm   5 w   0 d CRL: Not applicable Subchorionic hemorrhage:  None visualized. Maternal uterus/adnexae: The uterus is retroverted. No  intrauterine masses are seen. A corpus luteum is identified within the right ovary which is otherwise unremarkable. The left ovary contains multiple follicles and is unremarkable. Separate from the left ovary is a thin walled cystic structure within the left adnexa measuring 14 mm in diameter. This simple cyst demonstrates no internal architecture or mural nodularity. There is, however, small simple appearing free fluid within the left adnexa and within the cul-de-sac. IMPRESSION: Intrauterine gestational sac identified. Nonvisualization of a yolk sac and fetal pole roughly estimates the gestation between 5.0 and 5.5 weeks. Indeterminate cystic structure within the left adnexa with surrounding small free fluid within the pelvis. A heterotopic gestation is not excluded. Correlation with serial beta HCG and short-term imaging follow-up in 5-7 days would be helpful to differentiate a stable peritoneal inclusion cyst from a extra uterine gestational sac. Electronically Signed   By: Helyn Numbers M.D.   On: 12/09/2020 20:15    Procedures Procedures   Medications Ordered in ED Medications  metoCLOPramide (REGLAN) injection 10 mg (10 mg Intramuscular Given 12/10/20 0230)  diphenhydrAMINE (BENADRYL) capsule 25 mg (25 mg Oral Given 12/10/20 0230)  acetaminophen (TYLENOL) tablet 1,000 mg (1,000 mg Oral Given 12/10/20 0230)    ED Course  I have reviewed the triage vital signs and the nursing notes.  Pertinent labs & imaging results that were available during my care of the patient were reviewed by me and considered in my medical decision making (see chart for details).    MDM Rules/Calculators/A&P                           Patient presents to the emergency department for evaluation of migraine headache. Patient is currently experiencing a headache that is similar to previous migraines. Patient does not have any unusual features compared to previous migraines. Patient has normal neurologic examination.  There are no unusual features,  such as unusual intensity or sudden onset. As this headache is similar to previous migraines, there is no concern for subarachnoid hemorrhage or other etiology. Patient therefore does not require imaging. Patient treated as migraine headache.  In addition to the migraine headache, patient complaining of left lower abdominal and pelvic pain.  She is pregnant.  Early pregnancy ultrasound shows intrauterine gestational sac.  Ultrasound findings also show a cystic structure near the left adnexa.  Cannot rule out heterotopic pregnancy.  Patient is in no distress.  She has had no vaginal bleeding.  Pain is not currently present.  Discussed with Dr. Vergie Living, on-call for OB/GYN.  He has reviewed images.  He recommends patient follow-up at the MAU Saturday afternoon for repeat beta-hCG.  Patient to return there sooner if she has increased pain, vaginal bleeding.  Final Clinical Impression(s) / ED Diagnoses Final diagnoses:  Less than [redacted] weeks gestation of pregnancy    Rx / DC Orders ED Discharge Orders     None        Lilyanah Celestin, Canary Brim, MD 12/10/20 [id number]

## 2020-12-10 NOTE — Discharge Instructions (Addendum)
[facility] el sbado por la tarde para repetir el anlisis de [place]. Si tiene sangrado vaginal o aumento del dolor, vaya all antes.  Go to [facility] Saturday afternoon to have a repeat Beta-HCG blood test. If you have any vaginal bleeding or increased pain, go there sooner.

## 2020-12-10 NOTE — ED Notes (Signed)
Pt states last period was in September.

## 2020-12-11 ENCOUNTER — Inpatient Hospital Stay (HOSPITAL_COMMUNITY)
Admission: AD | Admit: 2020-12-11 | Discharge: 2020-12-11 | Disposition: A | Discharge status: Home / Self Care | Payer: Medicaid Other | Attending: Family Medicine | Admitting: Family Medicine

## 2020-12-11 ENCOUNTER — Inpatient Hospital Stay (HOSPITAL_COMMUNITY): Payer: Medicaid Other

## 2020-12-11 ENCOUNTER — Other Ambulatory Visit: Payer: Self-pay

## 2020-12-11 ENCOUNTER — Encounter (HOSPITAL_COMMUNITY): Payer: Self-pay | Admitting: Family Medicine

## 2020-12-11 DIAGNOSIS — O3680X1 Pregnancy with inconclusive fetal viability, fetus 1: Secondary | ICD-10-CM

## 2020-12-11 DIAGNOSIS — Z3181 Encounter for male factor infertility in female patient: Secondary | ICD-10-CM | POA: Insufficient documentation

## 2020-12-11 DIAGNOSIS — Z3481 Encounter for supervision of other normal pregnancy, first trimester: Secondary | ICD-10-CM | POA: Diagnosis present

## 2020-12-11 DIAGNOSIS — Z3491 Encounter for supervision of normal pregnancy, unspecified, first trimester: Secondary | ICD-10-CM | POA: Diagnosis present

## 2020-12-11 DIAGNOSIS — Z3A01 Less than 8 weeks gestation of pregnancy: Secondary | ICD-10-CM | POA: Insufficient documentation

## 2020-12-11 LAB — CBC
HCT: 35.1 % — ABNORMAL LOW (ref 36.0–46.0)
Hemoglobin: 11.3 g/dL — ABNORMAL LOW (ref 12.0–15.0)
MCH: 30.3 pg (ref 26.0–34.0)
MCHC: 32.2 g/dL (ref 30.0–36.0)
MCV: 94.1 fL (ref 80.0–100.0)
Platelets: 395 10*3/uL (ref 150–400)
RBC: 3.73 MIL/uL — ABNORMAL LOW (ref 3.87–5.11)
RDW: 12.5 % (ref 11.5–15.5)
WBC: 12.5 10*3/uL — ABNORMAL HIGH (ref 4.0–10.5)
nRBC: 0 % (ref 0.0–0.2)

## 2020-12-11 LAB — HCG, QUANTITATIVE, PREGNANCY: hCG, Beta Chain, Quant, S: 3852 m[IU]/mL — ABNORMAL HIGH (ref <5)

## 2020-12-11 NOTE — MAU Note (Addendum)
Pt seen at [facility] on 11/17. (Found out she was preg, went in for the pain on the left side).  Here today for follow up, repeat hcg level. Chart is for merger, HCG on 11/17 was 1970. no bleeding, has not had any.  Pain in LLQ, comes and goes. Plan of care and orders discussed.

## 2020-12-11 NOTE — MAU Provider Note (Signed)
History   Chief Complaint:  Follow-up   Betty Scott is  30 y.o. G1P0 Patient's last menstrual period was 09/26/2020.Marland Kitchen Patient is here for follow up of quantitative HCG and ongoing surveillance of pregnancy status. She is [redacted] weeks gestation  by LMP.    Since her last visit, the patient is without new complaint. The patient reports bleeding as  none now.  She denies any pain.  General ROS:  negative  Her previous Quantitative HCG values are:  11/17: 1970 at [facility]  Physical Exam   Blood pressure 108/66, pulse 86, temperature 98.4 F (36.9 C), temperature source Oral, resp. rate 18, height 5\' 4"  (1.626 m), weight 71.4 kg, last menstrual period 09/26/2020, SpO2 100 %.  Physical Exam Vitals and nursing note reviewed.  Constitutional:      General: She is not in acute distress.    Appearance: She is well-developed.  HENT:     Head: Normocephalic.  Eyes:     Pupils: Pupils are equal, round, and reactive to light.  Cardiovascular:     Rate and Rhythm: Normal rate and regular rhythm.  Pulmonary:     Effort: Pulmonary effort is normal. No respiratory distress.     Breath sounds: Normal breath sounds.  Abdominal:     Palpations: Abdomen is soft.     Tenderness: There is no abdominal tenderness.  Musculoskeletal:        General: Normal range of motion.     Cervical back: Normal range of motion.  Skin:    General: Skin is warm and dry.  Neurological:     Mental Status: She is alert and oriented to person, place, and time.  Psychiatric:        Behavior: Behavior normal.        Thought Content: Thought content normal.        Judgment: Judgment normal.     Labs: Results for orders placed or performed during the hospital encounter of 12/11/20 (from the past 24 hour(s))  CBC   Collection Time: 12/11/20  6:36 PM  Result Value Ref Range   WBC 12.5 (H) 4.0 - 10.5 K/uL   RBC 3.73 (L) 3.87 - 5.11 MIL/uL   Hemoglobin 11.3 (L) 12.0 - 15.0 g/dL   HCT 12/13/20 (L) 79.0 - 24.0 %    MCV 94.1 80.0 - 100.0 fL   MCH 30.3 26.0 - 34.0 pg   MCHC 32.2 30.0 - 36.0 g/dL   RDW 97.3 53.2 - 99.2 %   Platelets 395 150 - 400 K/uL   nRBC 0.0 0.0 - 0.2 %  hCG, quantitative, pregnancy   Collection Time: 12/11/20  6:36 PM  Result Value Ref Range   hCG, Beta Chain, Quant, S 3,852 (H) <5 mIU/mL    Ultrasound Studies:   12/13/20 OB Transvaginal  Result Date: 12/11/2020 CLINICAL DATA:  Left lower quadrant pain, evaluate for viability EXAM: TRANSVAGINAL OB ULTRASOUND TECHNIQUE: Transvaginal ultrasound was performed for complete evaluation of the gestation as well as the maternal uterus, adnexal regions, and pelvic cul-de-sac. COMPARISON:  None. FINDINGS: Intrauterine gestational sac: Present Yolk sac:  Present Embryo:  Absent MSD: 0.9 mm   5 w   1 d Subchorionic hemorrhage:  None visualized. Maternal uterus/adnexae: Minimal free fluid is noted. Corpus luteum cyst is noted within the right ovary. Exophytic cyst is noted within the left ovary. IMPRESSION: Probable early intrauterine gestational sac and yolk sac but no fetal pole, or cardiac activity yet visualized. Recommend follow-up quantitative B-HCG levels and  follow-up US in 14 days to assess viability. This recommendation follows SRU consensus guidelines: Diagnostic Criteria for Nonviable Pregnancy Early in the First Trimester. Malva Limes Med 2013; 397:6734-19. Electronically Signed   By: Alcide Clever M.D.   On: 12/11/2020 20:08    Assessment:   1. Normal intrauterine pregnancy on prenatal ultrasound in first trimester   2. [redacted] weeks gestation of pregnancy     -Reviewed normal ultrasound and labs. Discussed repeat ultrasound in 2 weeks and patient agreeable to plan of care  Plan: -Discharge home in stable condition -First trimester precautions discussed -Patient advised to follow-up with OB as scheduled for prenatal care -Patient may return to MAU as needed or if her condition were to change or worsen  Rolm Bookbinder, CNM 12/11/2020, 8:16  PM

## 2020-12-11 NOTE — Discharge Instructions (Signed)

## 2020-12-13 ENCOUNTER — Encounter (HOSPITAL_COMMUNITY): Payer: Self-pay | Admitting: Family Medicine

## 2020-12-23 ENCOUNTER — Other Ambulatory Visit: Payer: Self-pay

## 2020-12-23 ENCOUNTER — Ambulatory Visit
Admission: RE | Admit: 2020-12-23 | Discharge: 2020-12-23 | Disposition: A | Discharge status: Home / Self Care | Payer: Medicaid Other | Source: Ambulatory Visit

## 2020-12-23 DIAGNOSIS — Z3491 Encounter for supervision of normal pregnancy, unspecified, first trimester: Secondary | ICD-10-CM | POA: Insufficient documentation

## 2020-12-23 DIAGNOSIS — Z3A01 Less than 8 weeks gestation of pregnancy: Secondary | ICD-10-CM | POA: Diagnosis present

## 2020-12-24 ENCOUNTER — Telehealth: Payer: Self-pay | Admitting: Medical

## 2020-12-24 NOTE — Telephone Encounter (Signed)
I called Betty Scott today at 10:48 AM and confirmed patient's identity using two patient identifiers using [employer]. [place] results from yesterday were reviewed. Patient is not yet scheduled for new OB visit. Patient has an appointment for Medicaid on Monday. Will call the offices in the area establish care. Questions regarding prenatal vitamins answered. First trimester warning signs reviewed. Patient voiced understanding and had no further questions.   US OB Transvaginal  Result Date: 12/23/2020 CLINICAL DATA:  Follow-up evaluation for viability. EXAM: TRANSVAGINAL OB ULTRASOUND TECHNIQUE: Transvaginal ultrasound was performed for complete evaluation of the gestation as well as the maternal uterus, adnexal regions, and pelvic cul-de-sac. COMPARISON:  12/11/2020 FINDINGS: Intrauterine gestational sac: Single Yolk sac:  Visualized. Embryo:  Visualized. Cardiac Activity: Visualized. Heart Rate: 121 bpm CRL:   6.9 mm   6 w 4 d                  [place] EDC: 08/14/2021 Subchorionic hemorrhage:  None visualized. Maternal uterus/adnexae: Unremarkable appearance of the right ovary. Unchanged 1.4 cm simple left ovarian cyst. Small volume free fluid in the left adnexa. IMPRESSION: Single viable IUP as above. Electronically Signed   By: Sebastian Ache M.D.   On: 12/23/2020 15:40    Betty Lowenstein, PA-C 12/24/2020 10:48 AM

## 2020-12-27 ENCOUNTER — Other Ambulatory Visit: Payer: Medicaid Other

## 2021-01-09 ENCOUNTER — Emergency Department (HOSPITAL_COMMUNITY)
Admission: EM | Admit: 2021-01-09 | Discharge: 2021-01-09 | Disposition: A | Discharge status: Home / Self Care | Payer: Medicaid Other | Attending: Physician Assistant | Admitting: Physician Assistant

## 2021-01-09 ENCOUNTER — Other Ambulatory Visit: Payer: Self-pay

## 2021-01-09 ENCOUNTER — Encounter (HOSPITAL_COMMUNITY): Payer: Self-pay | Admitting: Oncology

## 2021-01-09 DIAGNOSIS — Z5321 Procedure and treatment not carried out due to patient leaving prior to being seen by health care provider: Secondary | ICD-10-CM | POA: Insufficient documentation

## 2021-01-09 DIAGNOSIS — Z3A01 Less than 8 weeks gestation of pregnancy: Secondary | ICD-10-CM | POA: Diagnosis not present

## 2021-01-09 DIAGNOSIS — N9489 Other specified conditions associated with female genital organs and menstrual cycle: Secondary | ICD-10-CM | POA: Insufficient documentation

## 2021-01-09 DIAGNOSIS — O26891 Other specified pregnancy related conditions, first trimester: Secondary | ICD-10-CM | POA: Insufficient documentation

## 2021-01-09 DIAGNOSIS — N898 Other specified noninflammatory disorders of vagina: Secondary | ICD-10-CM | POA: Diagnosis not present

## 2021-01-09 DIAGNOSIS — R109 Unspecified abdominal pain: Secondary | ICD-10-CM | POA: Insufficient documentation

## 2021-01-09 LAB — HCG, QUANTITATIVE, PREGNANCY: hCG, Beta Chain, Quant, S: 138140 m[IU]/mL — ABNORMAL HIGH (ref <5)

## 2021-01-09 NOTE — ED Provider Notes (Signed)
Emergency Medicine Provider Triage Evaluation Note  Betty Scott , a 30 y.o. female  was evaluated in triage.  Pt states she is currently pregnant. She is c/o pelvic pain and vaginal itching. She had an [place] 12/1 that showed and IUP.   Review of Systems  Positive: Pelvic pain, vaginal itching Negative: Vaginal bleeding  Physical Exam  BP 119/79 (BP Location: Right Arm)    Pulse 88    Temp 98.8 F (37.1 C) (Oral)    Resp 16    LMP 09/26/2020    SpO2 100%  Gen:   Awake, no distress   Resp:  Normal effort  MSK:   Moves extremities without difficulty   Medical Decision Making  Medically screening exam initiated at 12:13 PM.  Appropriate orders placed.  Betty Scott was informed that the remainder of the evaluation will be completed by another provider, this initial triage assessment does not replace that evaluation, and the importance of remaining in the ED until their evaluation is complete.     Karrie Meres, PA-C 01/09/21 1213    Rolan Bucco, MD 01/09/21 1227

## 2021-01-09 NOTE — ED Triage Notes (Signed)
Pt reports being [redacted] weeks pregnant.  Pt endorses abdominal pain and vaginal itching.  Denies any vaginal bleeding.

## 2021-01-09 NOTE — ED Notes (Signed)
Patient to take back to a room but no response.

## 2021-01-11 ENCOUNTER — Emergency Department (HOSPITAL_COMMUNITY)
Admission: EM | Admit: 2021-01-11 | Discharge: 2021-01-11 | Discharge status: Against Medical Advice | Payer: Medicaid Other | Source: Home / Self Care

## 2021-01-19 ENCOUNTER — Ambulatory Visit: Payer: Self-pay

## 2021-01-19 NOTE — Telephone Encounter (Signed)
[place], [place] #[id number]   Chief Complaint: sore throat and congestion and pregnant  Symptoms: sore throat, congestion, allergies Frequency: today  Pertinent Negatives: Patient denies fever Disposition: [] ED /[] Urgent Care (no appt availability in office) / [] Appointment(In office/virtual)/ []  [facility]/ [x] Home Care/ [] Refused Recommended Disposition  Additional Notes: pt is 2 months pregnant and asking what she can take for symptoms OTC. Advised pt she can take tylenol Cold and sinus for congestion and use Chloracetic lozenges or spray for sore throat and encouraged plenty of fluids. Advised her if symptoms don't improve in a few days to go to UC to be seen d/t no PCP. Pt verbalized understanding.   Reason for Disposition  [1] Sore throat with cough/cold symptoms AND [2] present < 5 days  Answer Assessment - Initial Assessment Questions 1. ONSET: "When did the throat start hurting?" (Hours or days ago)      today 2. SEVERITY: "How bad is the sore throat?" (Scale 1-10; mild, moderate or severe)   - MILD (1-3):  doesn't interfere with eating or normal activities   - MODERATE (4-7): interferes with eating some solids and normal activities   - SEVERE (8-10):  excruciating pain, interferes with most normal activities   - SEVERE DYSPHAGIA: can't swallow liquids, drooling     moderate 3. STREP EXPOSURE: "Has there been any exposure to strep within the past week?" If Yes, ask: "What type of contact occurred?"      NO 4.  VIRAL SYMPTOMS: "Are there any symptoms of a cold, such as a runny nose, cough, hoarse voice or red eyes?"      yes 5. FEVER: "Do you have a fever?" If Yes, ask: "What is your temperature, how was it measured, and when did it start?"     NO 7. OTHER SYMPTOMS: "Do you have any other symptoms?" (e.g., difficulty breathing, headache, rash)     Cough, congestion 8. PREGNANCY: "Is there any chance you are pregnant?" "When was your last menstrual period?"     Yes 2 months  pregnant  Protocols used: Sore Throat-A-AH

## 2021-01-20 ENCOUNTER — Other Ambulatory Visit: Payer: Self-pay

## 2021-01-20 ENCOUNTER — Emergency Department (HOSPITAL_COMMUNITY)
Admission: EM | Admit: 2021-01-20 | Discharge: 2021-01-20 | Disposition: A | Discharge status: Home / Self Care | Payer: Medicaid Other | Attending: Emergency Medicine | Admitting: Emergency Medicine

## 2021-01-20 DIAGNOSIS — U071 COVID-19: Secondary | ICD-10-CM

## 2021-01-20 DIAGNOSIS — Z79899 Other long term (current) drug therapy: Secondary | ICD-10-CM | POA: Diagnosis not present

## 2021-01-20 DIAGNOSIS — R519 Headache, unspecified: Secondary | ICD-10-CM | POA: Diagnosis present

## 2021-01-20 LAB — RESP PANEL BY RT-PCR (FLU A&B, COVID) ARPGX2
Influenza A by PCR: NEGATIVE
Influenza B by PCR: NEGATIVE
SARS Coronavirus 2 by RT PCR: POSITIVE — AB

## 2021-01-20 MED ORDER — ACETAMINOPHEN 500 MG PO TABS
1000.0000 mg | ORAL_TABLET | Freq: Once | ORAL | Status: AC
  Administered 2021-01-20: 12:00:00 1000 mg via ORAL
  Filled 2021-01-20: qty 2

## 2021-01-20 NOTE — ED Triage Notes (Signed)
Pt reports headache & sore throat. Pt also reports being 2 months pregnant

## 2021-01-20 NOTE — Discharge Instructions (Addendum)
You were evaluated in the Emergency Department and after careful evaluation, we did not find any emergent condition requiring admission or further testing in the hospital.  Your exam/testing today was overall reassuring. You do not have an oxygen requirement or need to be admitted to the hospital. Recommend Tylenol 1g (1000mg ) every 6 hours for headache, muscle aches, and fever.   Please return to the Emergency Department if you experience any worsening of your condition.  Thank you for allowing to be a part of your care.

## 2021-01-20 NOTE — ED Provider Notes (Addendum)
[facility]-EMERGENCY DEPT Provider Note   CSN: [id number] Arrival date & time: 01/20/21  1045     History Chief Complaint  Patient presents with   Headache   Sore Throat    Betty Scott is a 30 y.o. female.   Headache Associated symptoms: cough and sore throat   Sore Throat Associated symptoms include headaches.   30 year old female who is [redacted] weeks pregnant by ultrasound dating, follows outpatient with OB who presents to the emergency department with roughly 2 days of sore throat and headache.  The history is provided with the aid of a Spanish language tele interpreter.  The patient states that she has had no sick contacts.  She states that she has a progressively worsening bilateral sore throat.  No difficulty phonating or swallowing.  No neck pain or stiffness or rigidity.  She endorses a frontal headache that radiates across the front of her head, gradual in onset moderate in intensity.  She has not taken anything for the headache.  She denies any fevers or chills.  She endorses a mild nonproductive cough.  She denies any chest pain or shortness of breath.  No past medical history on file.  There are no problems to display for this patient.   No past surgical history on file.   OB History     Gravida  1   Para  0   Term  0   Preterm  0   AB  0   Living         SAB  0   IAB  0   Ectopic  0   Multiple      Live Births              No family history on file.  Social History   Tobacco Use   Smoking status: Never   Smokeless tobacco: Never  Substance Use Topics   Alcohol use: Never   Drug use: Never    Home Medications Prior to Admission medications   Medication Sig Start Date End Date Taking? Authorizing Provider  acetaminophen (TYLENOL) 500 MG tablet Take 500 mg by mouth every 6 (six) hours as needed.    [provider]  naproxen (NAPROSYN) 500 MG tablet Take 1 tablet (500 mg total) by mouth 2 (two)  times daily with a meal. 08/04/20   Wallis Bamberg, PA-C  ondansetron (ZOFRAN) 4 MG tablet Take 1 tablet (4 mg total) by mouth every 6 (six) hours. 05/14/19   Arlyn Dunning, PA-C  tiZANidine (ZANAFLEX) 4 MG tablet Take 1 tablet (4 mg total) by mouth every 6 (six) hours as needed for muscle spasms. 08/04/20   Wallis Bamberg, PA-C    Allergies    Fish allergy, Fish allergy, and Fish-derived products  Review of Systems   Review of Systems  HENT:  Positive for sore throat.   Respiratory:  Positive for cough.   Neurological:  Positive for headaches.  All other systems reviewed and are negative.  Physical Exam Updated Vital Signs BP 117/73 (BP Location: Left Arm)    Pulse (!) 102    Temp 98.7 F (37.1 C) (Oral)    Resp 18    LMP 09/26/2020    SpO2 100%   Physical Exam Vitals and nursing note reviewed.  Constitutional:      General: She is not in acute distress.    Appearance: She is well-developed.  HENT:     Head: Normocephalic and atraumatic.  Mouth/Throat:     Pharynx: Posterior oropharyngeal erythema present. No pharyngeal swelling or oropharyngeal exudate.     Tonsils: No tonsillar exudate or tonsillar abscesses.     Comments: Mild bilateral oropharyngeal erythema without exudate.  No evidence of PTA. Eyes:     Conjunctiva/sclera: Conjunctivae normal.     Pupils: Pupils are equal, round, and reactive to light.  Cardiovascular:     Rate and Rhythm: Normal rate and regular rhythm.     Heart sounds: No murmur heard. Pulmonary:     Effort: Pulmonary effort is normal. No respiratory distress.     Breath sounds: Normal breath sounds.  Abdominal:     General: There is no distension.     Palpations: Abdomen is soft.     Tenderness: There is no abdominal tenderness. There is no guarding.  Musculoskeletal:        General: No swelling, deformity or signs of injury.     Cervical back: Full passive range of motion without pain, normal range of motion and neck supple.  Skin:    General:  Skin is warm and dry.     Capillary Refill: Capillary refill takes less than 2 seconds.     Findings: No lesion or rash.  Neurological:     General: No focal deficit present.     Mental Status: She is alert. Mental status is at baseline.  Psychiatric:        Mood and Affect: Mood normal.    ED Results / Procedures / Treatments   Labs (all labs ordered are listed, but only abnormal results are displayed) Labs Reviewed  RESP PANEL BY RT-PCR (FLU A&B, COVID) ARPGX2 - Abnormal; Notable for the following components:      Result Value   SARS Coronavirus 2 by RT PCR POSITIVE (*)    All other components within normal limits    EKG None  Radiology No results found.  Procedures Procedures   Medications Ordered in ED Medications  acetaminophen (TYLENOL) tablet 1,000 mg (1,000 mg Oral Given 01/20/21 1136)    ED Course  I have reviewed the triage vital signs and the nursing notes.  Pertinent labs & imaging results that were available during my care of the patient were reviewed by me and considered in my medical decision making (see chart for details).  Clinical Course as of 01/20/21 2210  Thu Jan 20, 2021  1233 SARS Coronavirus 2 by RT PCR(!): POSITIVE [JL]    Clinical Course User Index [JL] Ernie Avena, MD   MDM Rules/Calculators/A&P                          30 year old female who is [redacted] weeks pregnant by ultrasound dating, follows outpatient with OB who presents to the emergency department with roughly 2 days of sore throat and headache.  The patient states that she has had no sick contacts.  She states that she has a progressively worsening bilateral sore throat.  No difficulty phonating or swallowing.  No neck pain or stiffness or rigidity.  She endorses a frontal headache that radiates across the front of her head, gradual in onset moderate in intensity.  She has not taken anything for the headache.  She denies any fevers or chills.  She endorses a mild nonproductive cough.   She denies any chest pain or shortness of breath.  On arrival, the patient was afebrile, mildly tachycardic P102, saturating under percent on room air.  She had no  desaturations in the exam room upon assessment.  She presents with roughly 2 days of sore throat and headache.  She was administered Tylenol 1 g for her headache which resulted in resolution of her symptoms.  No meningeal symptoms or findings on exam.  No concern for RPA given good range of motion of the neck with no concern for PTA given no asymmetry or evidence of peritonsillar swelling.  No evidence for Ludwig's angina.  Patient overall tolerating oral intake and well-appearing.  She denies any vaginal bleeding or pelvic pain at this time.  She follows outpatient with OB for routine prenatal care.  Her SARS COV 2 PCR testing resulted positive for COVID-19.  She was advised continued symptomatic management at home with Tylenol and oral hydration, quarantine at home, and outpatient follow-up routinely with her OB.  Discussed the risks and benefits of Paxlovid and the patient elected to not take the medication.  Return precautions provided.   Final Clinical Impression(s) / ED Diagnoses Final diagnoses:  COVID-19    Rx / DC Orders ED Discharge Orders     None        Ernie Avena, MD 01/20/21 2211    Ernie Avena, MD 01/20/21 2211

## 2021-02-02 ENCOUNTER — Ambulatory Visit: Payer: Medicaid Other | Admitting: *Deleted

## 2021-02-02 DIAGNOSIS — Z3400 Encounter for supervision of normal first pregnancy, unspecified trimester: Secondary | ICD-10-CM

## 2021-02-02 DIAGNOSIS — Z3480 Encounter for supervision of other normal pregnancy, unspecified trimester: Secondary | ICD-10-CM

## 2021-02-02 MED ORDER — PREPLUS 27-1 MG PO TABS: 1.0000 | ORAL_TABLET | Freq: Every day | ORAL | 13 refills | Status: AC

## 2021-02-02 MED ORDER — BLOOD PRESSURE KIT DEVI: 1.0000 | 0 refills | Status: AC

## 2021-02-02 NOTE — Progress Notes (Signed)
New OB Intake  I connected with  Yaakov Guthrie on 02/02/21 at  8:20 AM EST by telephone Video Visit and verified that I am speaking with the correct person using two identifiers. Spanish Interpreter with [facility] #[id number], used for Intake. Nurse is located at [facility] and pt is located at Home.  I discussed the limitations, risks, security and privacy concerns of performing an evaluation and management service by telephone and the availability of in person appointments. I also discussed with the patient that there may be a patient responsible charge related to this service. The patient expressed understanding and agreed to proceed.  I explained I am completing New OB Intake today. We discussed her EDD of 08/14/21 that is based on [place] at [redacted]w[redacted]d on 12/23/20. Pt is G1/P0. I reviewed her allergies, medications, Medical/Surgical/OB history, and appropriate screenings. I informed her of [facility] services. Based on history, this is a/an  pregnancy uncomplicated .   There are no problems to display for this patient.   Concerns addressed today  Delivery Plans:  Plans to deliver at [facility].   MyChart/Babyscripts MyChart access verified. I explained pt will have some visits in office and some virtually. Babyscripts instructions given and order placed. Patient verifies receipt of registration text/e-mail. Account successfully created and app downloaded.  Blood Pressure Cuff  Blood pressure cuff ordered for patient to pick-up from [employer]. Explained after first prenatal appt pt will check weekly and document in Babyscripts.  Weight scale: Patient    have weight scale. Weight scale ordered   Anatomy US Explained first scheduled [place] will be around 19 weeks. Anatomy US scheduled for 19 wks at [facility]. Pt notified to arrive at [facility].  Labs Discussed Avelina Laine genetic screening with patient. Would like both Panorama and Horizon drawn at new OB visit. Routine prenatal labs needed.  Covid  Vaccine Patient has covid vaccine.   Social Determinants of Health Food Insecurity: Patient expresses food insecurity. Food Market information given to patient; explained patient may visit at the end of first OB appointment. Also given [employer] Information and told to come to office to pick up can food. WIC Referral: Patient is interested in referral to [facility]. Has WIC. Transportation: Patient denies transportation needs. Childcare: Discussed no children allowed at ultrasound appointments. Offered childcare services; patient declines childcare services at this time.  First visit review I reviewed new OB appt with pt. I explained she will have a pelvic exam, ob bloodwork with genetic screening, and PAP smear. Explained pt will be seen by Dr. Lillia Pauls at first visit; encounter routed to appropriate provider. Explained that patient will be seen by pregnancy navigator following visit with provider. [facility] information placed in AVS.   Harrel Lemon, RN 02/02/2021  8:37 AM

## 2021-02-09 ENCOUNTER — Other Ambulatory Visit: Payer: Self-pay

## 2021-02-09 ENCOUNTER — Other Ambulatory Visit (HOSPITAL_COMMUNITY)
Admission: RE | Admit: 2021-02-09 | Discharge: 2021-02-09 | Disposition: A | Discharge status: Home / Self Care | Payer: Medicaid Other | Source: Ambulatory Visit | Attending: Family Medicine | Admitting: Family Medicine

## 2021-02-09 ENCOUNTER — Encounter: Payer: Self-pay | Admitting: Family Medicine

## 2021-02-09 ENCOUNTER — Ambulatory Visit (INDEPENDENT_AMBULATORY_CARE_PROVIDER_SITE_OTHER): Payer: Medicaid Other | Admitting: Family Medicine

## 2021-02-09 VITALS — BP 99/65 | HR 115 | Wt 158.0 lb

## 2021-02-09 DIAGNOSIS — Z3A13 13 weeks gestation of pregnancy: Secondary | ICD-10-CM

## 2021-02-09 DIAGNOSIS — Z3480 Encounter for supervision of other normal pregnancy, unspecified trimester: Secondary | ICD-10-CM | POA: Diagnosis not present

## 2021-02-09 NOTE — Progress Notes (Signed)
Subjective:   Betty Scott is a 31 y.o. G1P0000 at 64w3dby 6 week [place] seen today for her first obstetrical visit. She reports no N/V, pelvic pain, bleeding, or dysuria. She has a little bit of decreased appetite but she is still able to snack throughout the day. She is taking a prenatal vitamin. Patient does intend to breast feed.   Patient reports no complaints. She is excited about her pregnancy.  HISTORY: OB History  Gravida Para Term Preterm AB Living  1 0 0 0 0 0  SAB IAB Ectopic Multiple Live Births  0 0 0 0 0    # Outcome Date GA Lbr Len/2nd Weight Sex Delivery Anes PTL Lv  1 Current            Unable to view a prior pap smear per chart review; patient does not think she has had this done. Will complete today.  Past Medical History:  Diagnosis Date   Medical history non-contributory    Past Surgical History:  Procedure Laterality Date   NO PAST SURGERIES     Family History  Problem Relation Age of Onset   Diabetes Mother    Hypertension Father    Social History   Tobacco Use   Smoking status: Former    Packs/day: 0.25    Years: 0.50    Pack years: 0.13    Types: Cigarettes   Smokeless tobacco: Never  Vaping Use   Vaping Use: Never used  Substance Use Topics   Alcohol use: Never   Drug use: Never   Allergies  Allergen Reactions   Fish Allergy Swelling    Rash, tongue swelling   Current Outpatient Medications on File Prior to Visit  Medication Sig Dispense Refill   acetaminophen (TYLENOL) 500 MG tablet Take 500 mg by mouth every 6 (six) hours as needed.     Blood Pressure Monitoring (BLOOD PRESSURE KIT) DEVI 1 Device by Does not apply route once a week. 1 each 0   Prenatal Vit-Fe Fumarate-FA (PREPLUS) 27-1 MG TABS Take 1 tablet by mouth daily. 30 tablet 13   No current facility-administered medications on file prior to visit.   Exam   Vitals:   02/09/21 1532  BP: 99/65  Pulse: (!) 115  Weight: 158 lb (71.7 kg)   Fetal Heart  Rate (bpm): 159  Chaperone present for exam. Pelvic Exam: Perineum: no hemorrhoids, normal perineum   Vulva: normal external genitalia, no lesions   Vagina:  normal mucosa, normal discharge   Cervix: no lesions and normal, pap smear done.   System: General: well-developed, well-nourished female in no acute distress   Skin: normal coloration and turgor, no rashes   Neurologic: oriented, normal, negative, normal mood   Extremities: normal strength, tone, and muscle mass, ROM of all joints is normal   HEENT extraocular movement intact and sclera clear   Mouth/Teeth mucous membranes moist   Cardiovascular: normal rate, warm and well perfused    Respiratory:  no respiratory distress   Abdomen: soft, non-tender, gravid     Assessment:   Pregnancy: G1P0000 Patient Active Problem List   Diagnosis Date Noted   Supervision of other normal pregnancy, antepartum 02/02/2021     Plan:  1. Supervision of other normal pregnancy, antepartum 2. [redacted] weeks gestation of pregnancy Progressing well in her pregnancy thus far. FHT within normal limits and VSS. Initial labs and pap smear completed. Discussed genetic screening and ordered today. Anatomy [place]. Counseled  regarding foods/medications that are safe in pregnancy. All questions and concerns addressed. Will follow up in 4 weeks for next prenatal visit. - Cytology - PAP( [place]) - Cervicovaginal ancillary only( [place]) - Culture, OB Urine - Genetic Screening - CBC/D/Plt+RPR+Rh+ABO+RubIgG... - HgB A1c  Initial labs drawn. Continue prenatal vitamins. Genetic Screening discussed, NIPS: ordered. Ultrasound discussed; fetal anatomic survey: ordered. Problem list reviewed and updated. The nature of [facility] with multiple MDs and other Advanced Practice Providers was explained to patient; also emphasized that residents, students are part of our team. Routine obstetric precautions  reviewed.  In-person Spanish interpreter used for entirety of visit.  Return in about 4 weeks (around 03/09/2021) for follow up OB visit.  Vilma Meckel, MD

## 2021-02-09 NOTE — Progress Notes (Signed)
NEW OB,  FLU vaccine given in LD, tolerated well.

## 2021-02-10 ENCOUNTER — Encounter: Payer: Self-pay | Admitting: Family Medicine

## 2021-02-10 DIAGNOSIS — O26899 Other specified pregnancy related conditions, unspecified trimester: Secondary | ICD-10-CM | POA: Insufficient documentation

## 2021-02-10 DIAGNOSIS — Z6791 Unspecified blood type, Rh negative: Secondary | ICD-10-CM | POA: Insufficient documentation

## 2021-02-10 LAB — CBC/D/PLT+RPR+RH+ABO+RUBIGG...
Antibody Screen: NEGATIVE
Basophils Absolute: 0 10*3/uL (ref 0.0–0.2)
Basos: 0 %
EOS (ABSOLUTE): 0.1 10*3/uL (ref 0.0–0.4)
Eos: 1 %
HCV Ab: <0.1 s/co ratio (ref 0.0–0.9)
HIV Screen 4th Generation wRfx: NONREACTIVE
Hematocrit: 35 % (ref 34.0–46.6)
Hemoglobin: 12.3 g/dL (ref 11.1–15.9)
Hepatitis B Surface Ag: NEGATIVE
Immature Grans (Abs): 0.1 10*3/uL (ref 0.0–0.1)
Immature Granulocytes: 1 %
Lymphocytes Absolute: 1.9 10*3/uL (ref 0.7–3.1)
Lymphs: 14 %
MCH: 31.4 pg (ref 26.6–33.0)
MCHC: 35.1 g/dL (ref 31.5–35.7)
MCV: 89 fL (ref 79–97)
Monocytes Absolute: 0.9 10*3/uL (ref 0.1–0.9)
Monocytes: 7 %
Neutrophils Absolute: 11.2 10*3/uL — ABNORMAL HIGH (ref 1.4–7.0)
Neutrophils: 77 %
Platelets: 480 10*3/uL — ABNORMAL HIGH (ref 150–450)
RBC: 3.92 x10E6/uL (ref 3.77–5.28)
RDW: 11.8 % (ref 11.7–15.4)
RPR Ser Ql: NONREACTIVE
Rh Factor: NEGATIVE
Rubella Antibodies, IGG: 1.6 index (ref >0.99)
WBC: 14.2 10*3/uL — ABNORMAL HIGH (ref 3.4–10.8)

## 2021-02-10 LAB — CERVICOVAGINAL ANCILLARY ONLY
Chlamydia: NEGATIVE
Comment: NEGATIVE
Comment: NEGATIVE
Comment: NORMAL
Neisseria Gonorrhea: NEGATIVE
Trichomonas: NEGATIVE

## 2021-02-10 LAB — HEMOGLOBIN A1C
Est. average glucose Bld gHb Est-mCnc: 108 mg/dL
Hgb A1c MFr Bld: 5.4 % (ref 4.8–5.6)

## 2021-02-10 LAB — HCV INTERPRETATION

## 2021-02-11 ENCOUNTER — Telehealth: Payer: Self-pay

## 2021-02-11 LAB — CULTURE, OB URINE

## 2021-02-11 LAB — URINE CULTURE, OB REFLEX

## 2021-02-11 NOTE — Telephone Encounter (Signed)
Attempted to contact about results, no answer, left vm 

## 2021-02-14 LAB — CYTOLOGY - PAP: Diagnosis: NEGATIVE

## 2021-02-21 ENCOUNTER — Encounter: Payer: Self-pay | Admitting: Obstetrics and Gynecology

## 2021-02-24 ENCOUNTER — Encounter: Payer: Self-pay | Admitting: Obstetrics and Gynecology

## 2021-03-16 ENCOUNTER — Encounter: Payer: Medicaid Other | Admitting: Advanced Practice Midwife

## 2021-03-16 NOTE — Progress Notes (Unsigned)
° °  PRENATAL VISIT NOTE  Subjective:  Betty Scott is a 31 y.o. G1P0000 at [redacted]w[redacted]d being seen today for ongoing prenatal care.  She is currently monitored for the following issues for this {Blank single:19197::"high-risk","low-risk"} pregnancy and has Supervision of other normal pregnancy, antepartum and Rh negative state in antepartum period on their problem list.  Patient reports {sx:14538}.   .  .   . Denies leaking of fluid.   The following portions of the patient's history were reviewed and updated as appropriate: allergies, current medications, past family history, past medical history, past social history, past surgical history and problem list.   Objective:  There were no vitals filed for this visit.  Fetal Status:           General:  Alert, oriented and cooperative. Patient is in no acute distress.  Skin: Skin is warm and dry. No rash noted.   Cardiovascular: Normal heart rate noted  Respiratory: Normal respiratory effort, no problems with respiration noted  Abdomen: Soft, gravid, appropriate for gestational age.        Pelvic: {Blank single:19197::"Cervical exam performed in the presence of a chaperone","Cervical exam deferred"}        Extremities: Normal range of motion.     Mental Status: Normal mood and affect. Normal behavior. Normal judgment and thought content.   Assessment and Plan:  Pregnancy: G1P0000 at [redacted]w[redacted]d  1. Supervision of other normal pregnancy, antepartum ***  2. [redacted] weeks gestation of pregnancy ***  3. Uses Spanish as primary spoken language ***   {Blank single:19197::"Term","Preterm"} labor symptoms and general obstetric precautions including but not limited to vaginal bleeding, contractions, leaking of fluid and fetal movement were reviewed in detail with the patient. Please refer to After Visit Summary for other counseling recommendations.   No follow-ups on file.  Future Appointments  Date Time Provider [place]  03/16/2021  3:50  PM Elvera Maria, CNM [facility] None  03/21/2021  1:30 PM [facility] US3 [facility]    Fatima Blank, CNM

## 2021-03-21 ENCOUNTER — Ambulatory Visit: Payer: Medicaid Other | Attending: Obstetrics and Gynecology

## 2021-03-31 ENCOUNTER — Encounter: Payer: Medicaid Other | Admitting: Family Medicine

## 2021-04-07 ENCOUNTER — Telehealth: Payer: Self-pay | Admitting: Obstetrics

## 2022-05-09 IMAGING — US US OB COMP LESS 14 WK
1 series · 15 of 28 positions shown · non-contrast
Comparison: None.

CLINICAL DATA: Pelvic pain, pregnancy

EXAM:
OBSTETRIC <14 WK US AND TRANSVAGINAL OB US
TECHNIQUE: Both transabdominal and transvaginal ultrasound examinations were
performed for complete evaluation of the gestation as well as the
maternal uterus, adnexal regions, and pelvic cul-de-sac.
Transvaginal technique was performed to assess early pregnancy.

[Series 1: us ob comp less 14 wks mc & wl · 60 acquisitions, 15 frames shown]
[im 1/60]
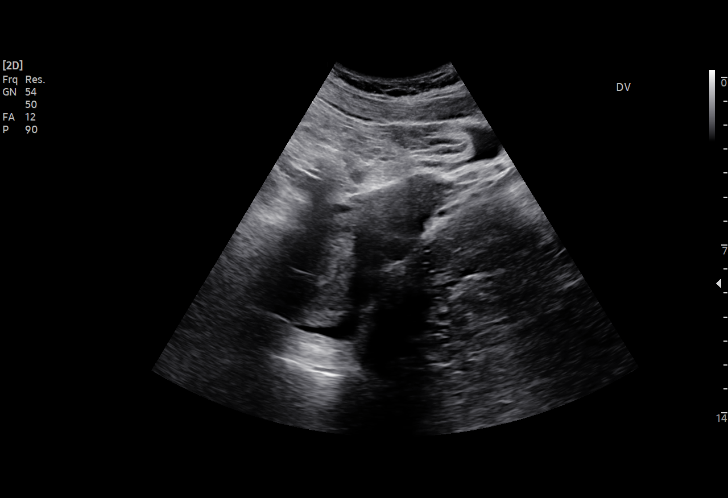
[im 5/60]
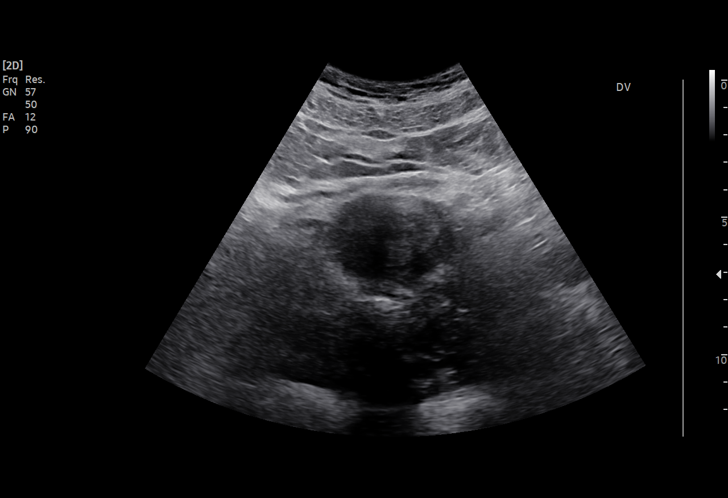
[im 9/60]
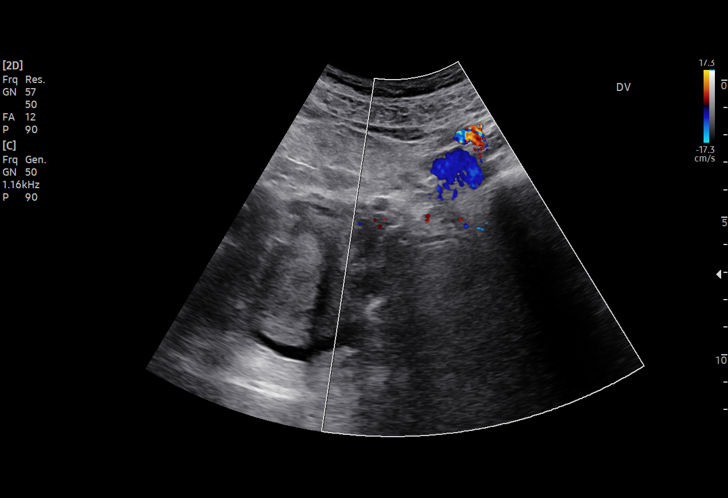
[im 14/60]
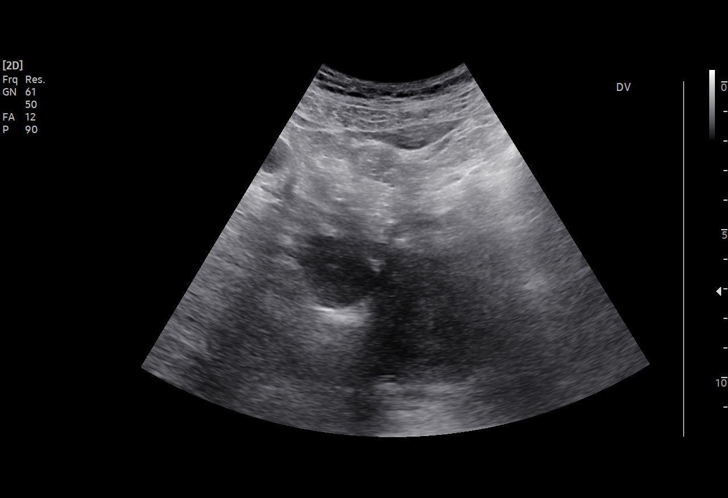
[im 18/60]
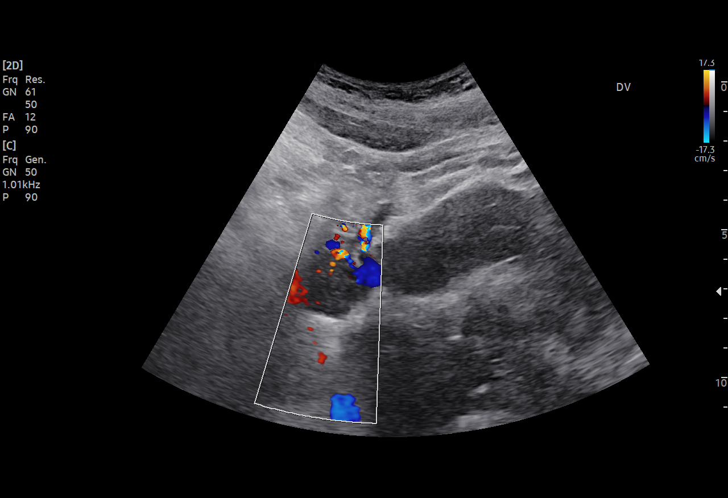
[im 22/60]
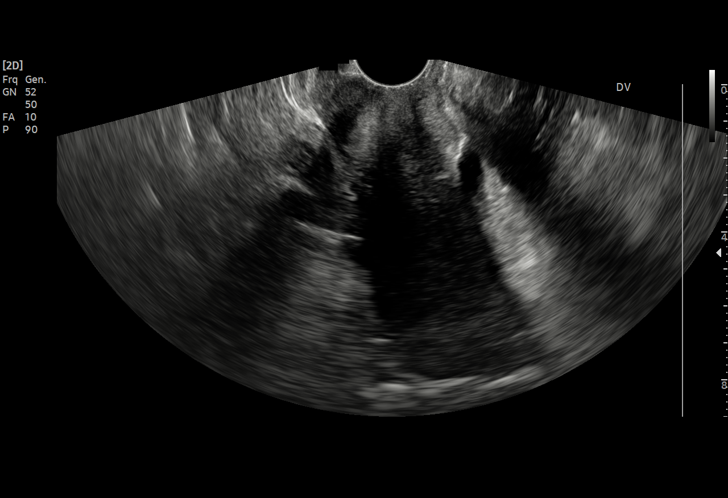
[im 27/60]
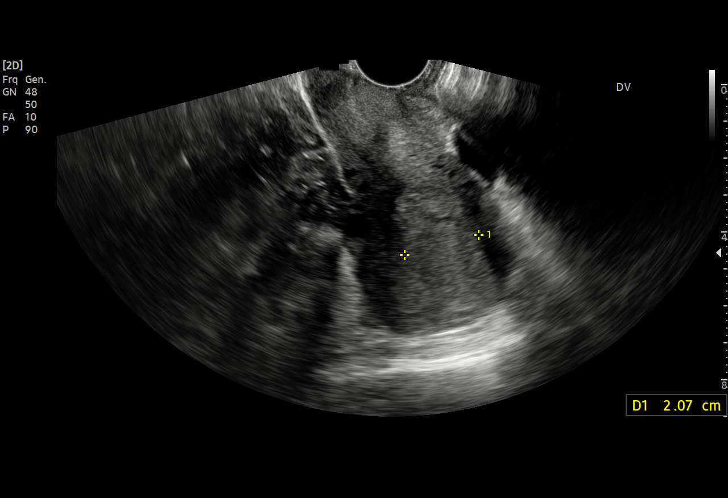
[im 31/60]
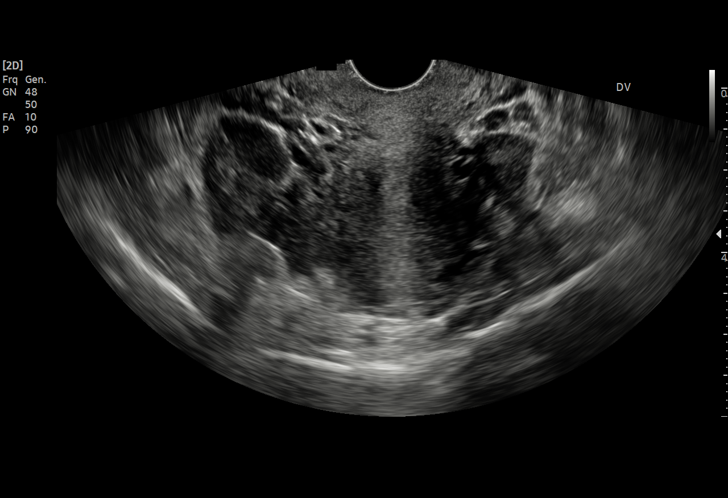
[im 33/60]
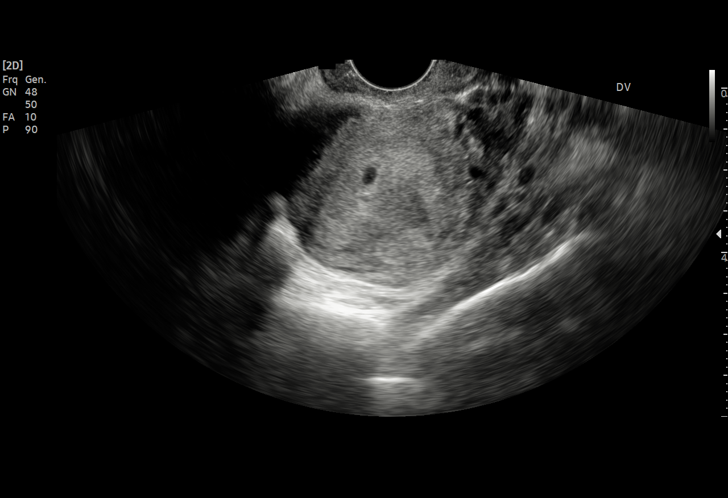
[im 38/60]
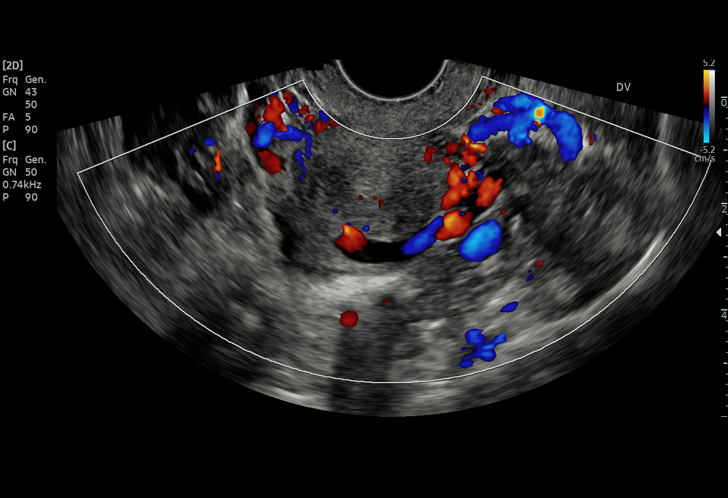
[im 42/60]
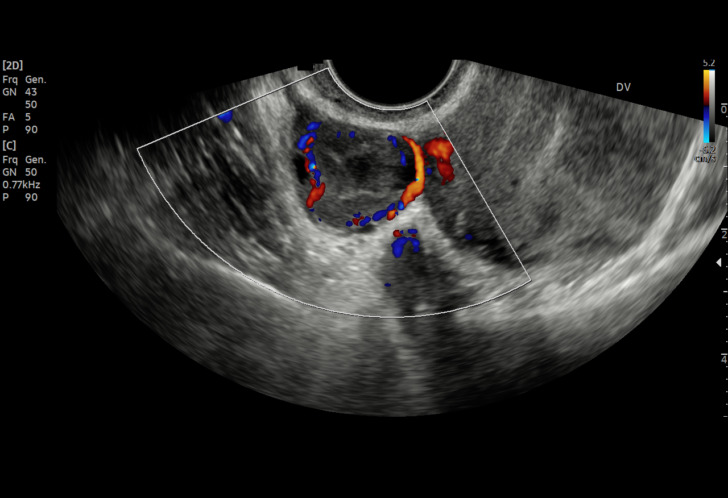
[im 46/60]
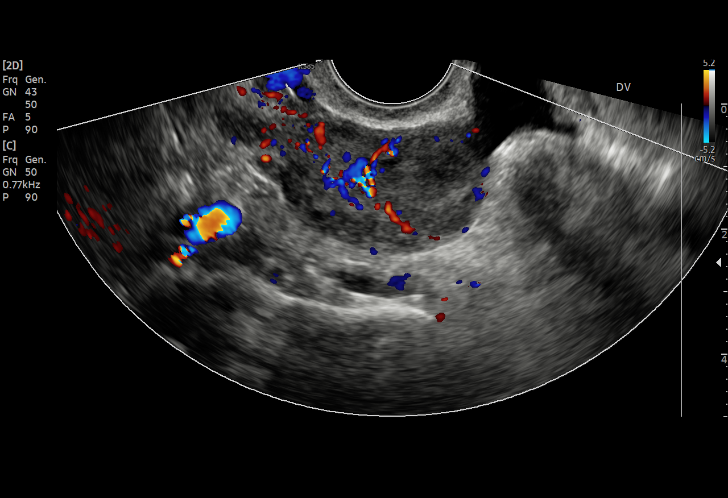
[im 51/60]
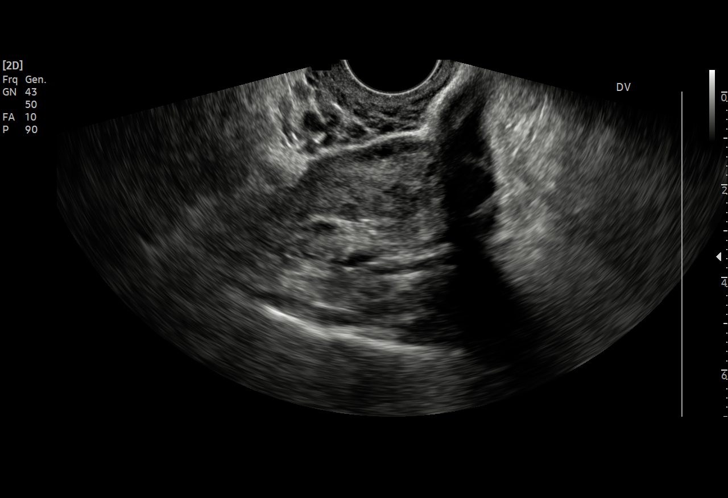
[im 55/60]
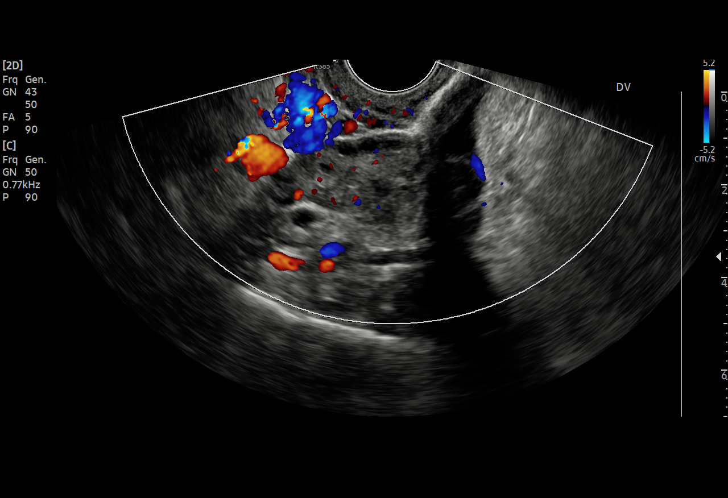
[im 60/60]
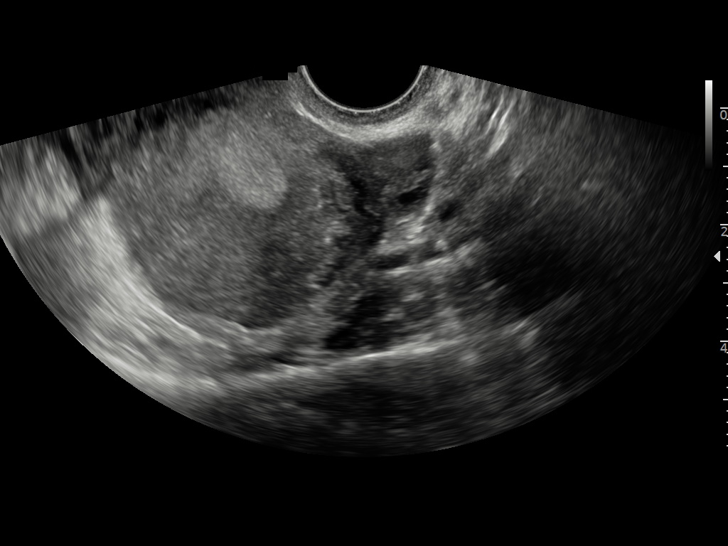

[15 of 28 positions shown; findings below may reference images not displayed]

FINDINGS: Intrauterine gestational sac: Present, single

Yolk sac:  Not identified

Embryo:  Not identified

Cardiac Activity: Not applicable

MSD: 3 mm   5 w   0 d

CRL: Not applicable

Subchorionic hemorrhage:  None visualized.

Maternal uterus/adnexae: The uterus is retroverted. No intrauterine
masses are seen. A corpus luteum is identified within the right
ovary which is otherwise unremarkable. The left ovary contains
multiple follicles and is unremarkable. Separate from the left ovary
is a thin walled cystic structure within the left adnexa measuring
14 mm in diameter. This simple cyst demonstrates no internal
architecture or mural nodularity. There is, however, small simple
appearing free fluid within the left adnexa and within the
cul-de-sac.
IMPRESSION: Intrauterine gestational sac identified. Nonvisualization of a yolk
sac and fetal pole roughly estimates the gestation between 5.0 and
5.5 weeks.

Indeterminate cystic structure within the left adnexa with
surrounding small free fluid within the pelvis. A heterotopic
gestation is not excluded. Correlation with serial beta HCG and
short-term imaging follow-up in 5-7 days would be helpful to
differentiate a stable peritoneal inclusion cyst from a extra
uterine gestational sac.

## 2022-05-09 IMAGING — US US OB TRANSVAGINAL
1 series · 15 of 28 positions shown · non-contrast
Comparison: None.

CLINICAL DATA: Pelvic pain, pregnancy

EXAM:
OBSTETRIC <14 WK US AND TRANSVAGINAL OB US
TECHNIQUE: Both transabdominal and transvaginal ultrasound examinations were
performed for complete evaluation of the gestation as well as the
maternal uterus, adnexal regions, and pelvic cul-de-sac.
Transvaginal technique was performed to assess early pregnancy.

[Series 1: us ob comp less 14 wks mc & wl · 60 acquisitions, 15 frames shown]
[im 1/60]
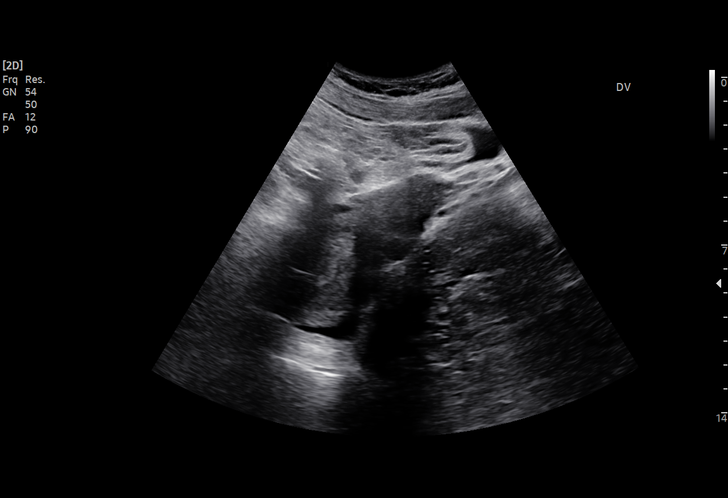
[im 5/60]
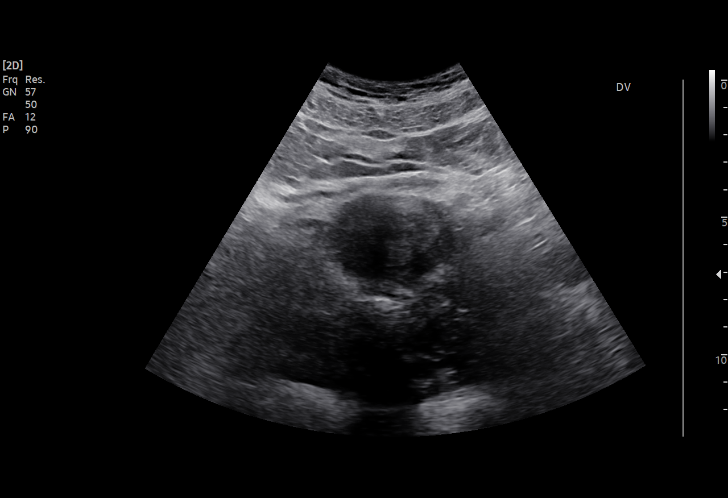
[im 9/60]
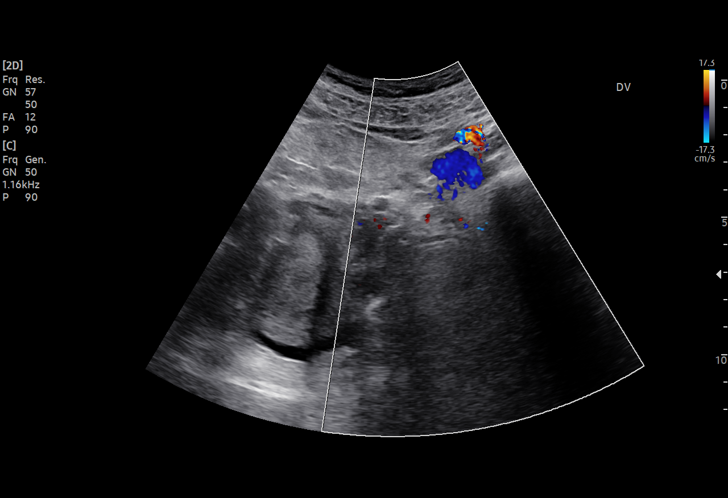
[im 14/60]
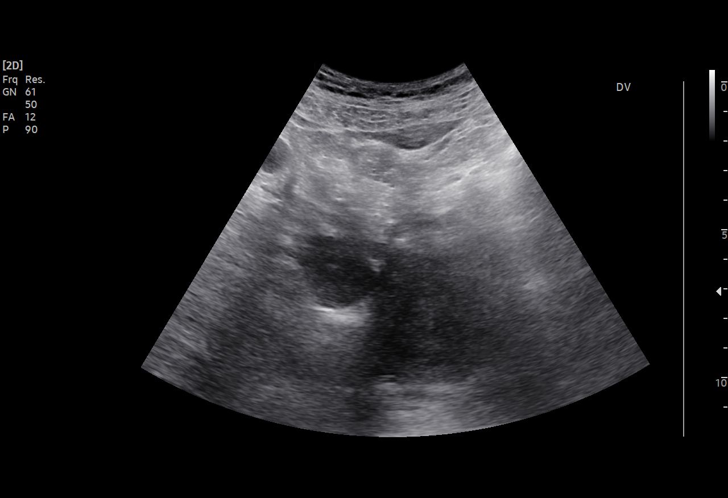
[im 18/60]
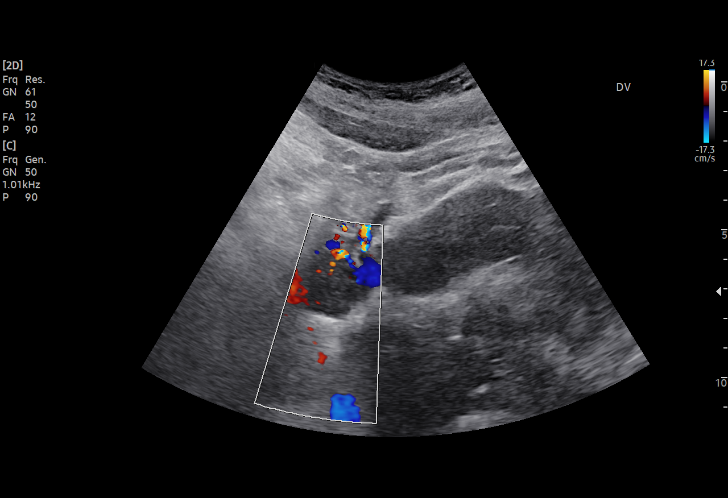
[im 22/60]
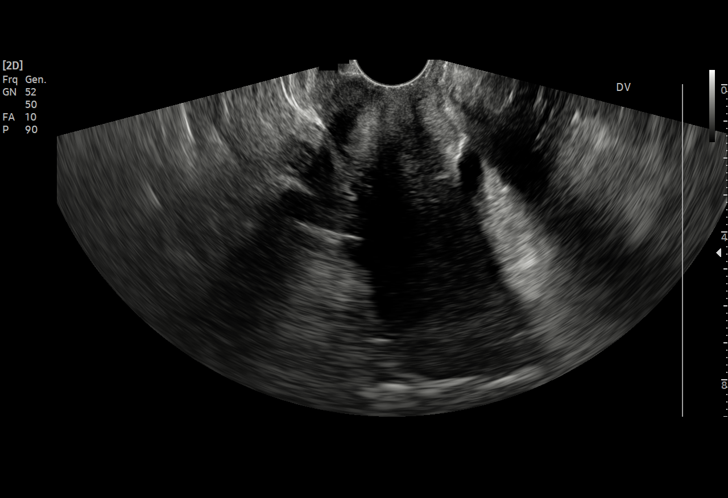
[im 27/60]
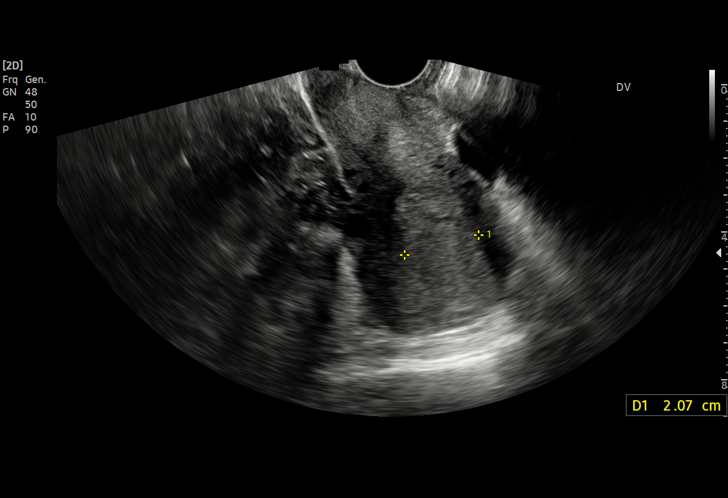
[im 31/60]
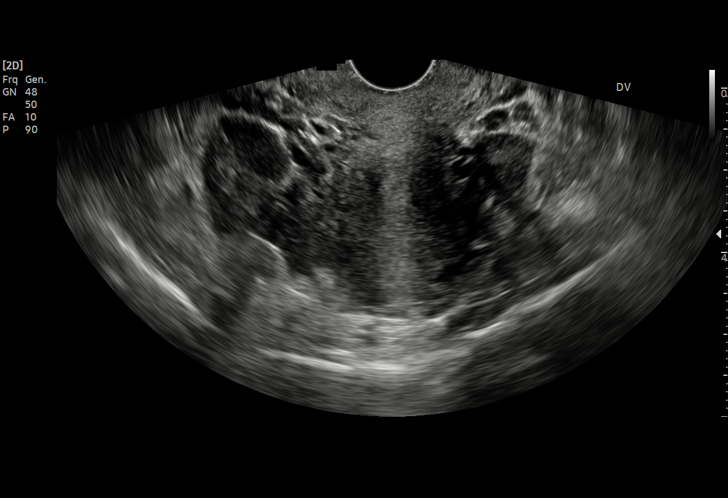
[im 33/60]
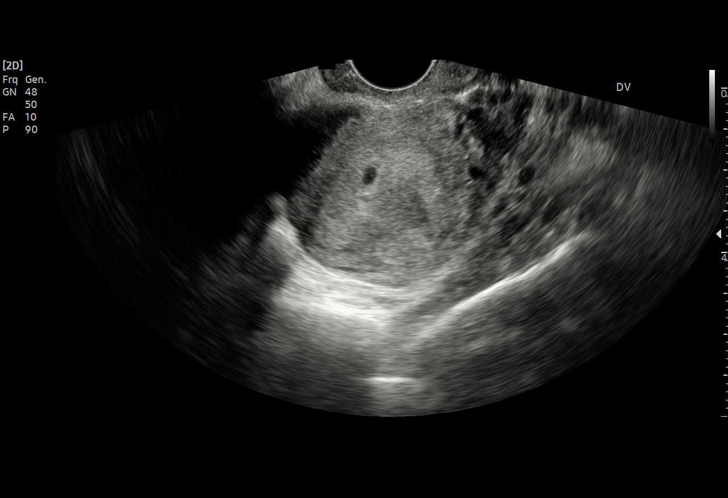
[im 38/60]
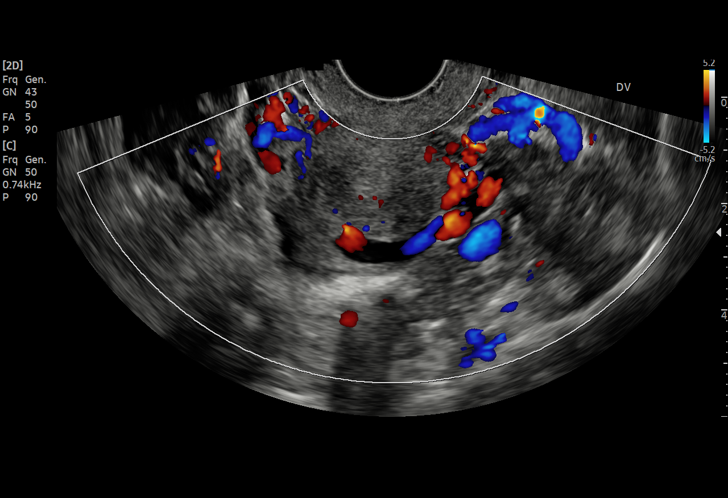
[im 42/60]
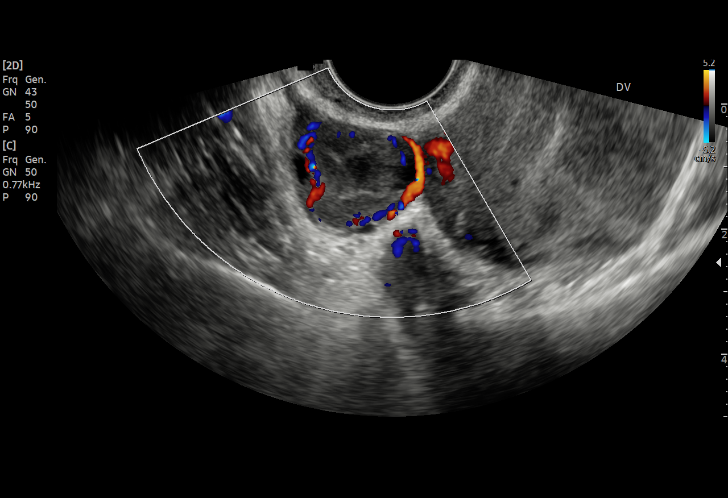
[im 46/60]
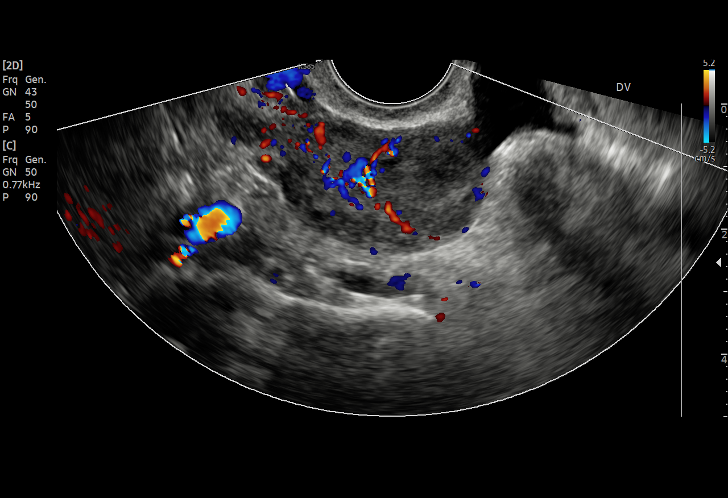
[im 51/60]
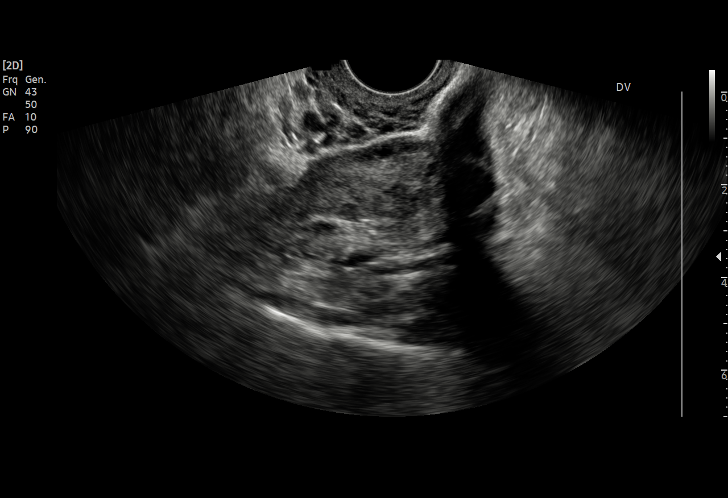
[im 55/60]
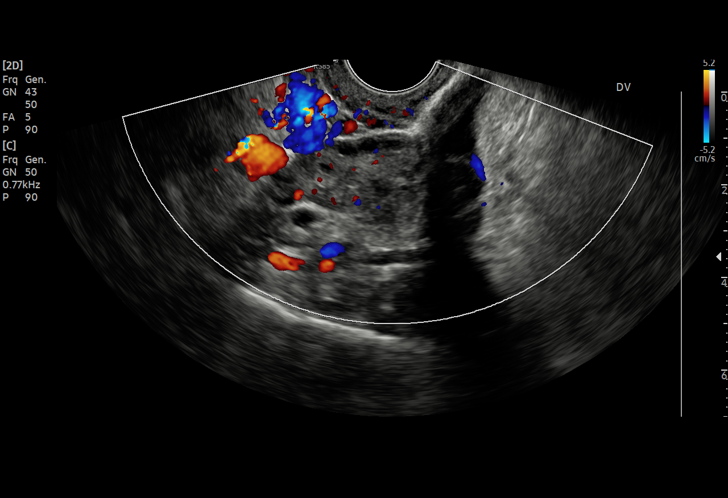
[im 60/60]
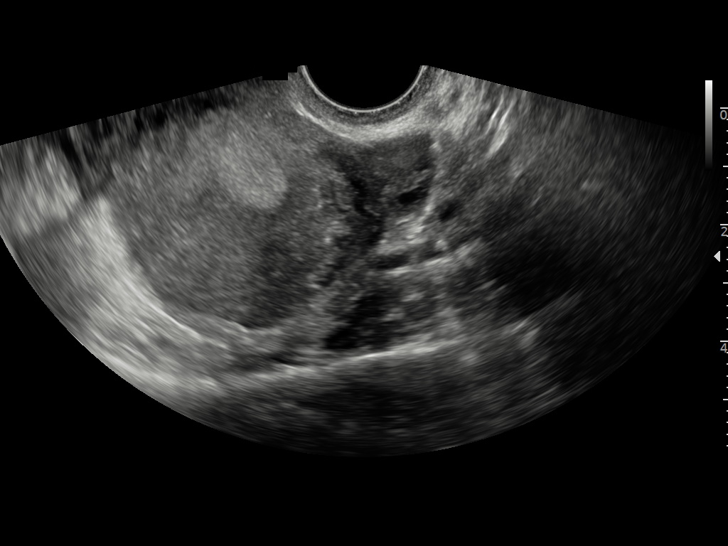

[15 of 28 positions shown; findings below may reference images not displayed]

FINDINGS: Intrauterine gestational sac: Present, single

Yolk sac:  Not identified

Embryo:  Not identified

Cardiac Activity: Not applicable

MSD: 3 mm   5 w   0 d

CRL: Not applicable

Subchorionic hemorrhage:  None visualized.

Maternal uterus/adnexae: The uterus is retroverted. No intrauterine
masses are seen. A corpus luteum is identified within the right
ovary which is otherwise unremarkable. The left ovary contains
multiple follicles and is unremarkable. Separate from the left ovary
is a thin walled cystic structure within the left adnexa measuring
14 mm in diameter. This simple cyst demonstrates no internal
architecture or mural nodularity. There is, however, small simple
appearing free fluid within the left adnexa and within the
cul-de-sac.
IMPRESSION: Intrauterine gestational sac identified. Nonvisualization of a yolk
sac and fetal pole roughly estimates the gestation between 5.0 and
5.5 weeks.

Indeterminate cystic structure within the left adnexa with
surrounding small free fluid within the pelvis. A heterotopic
gestation is not excluded. Correlation with serial beta HCG and
short-term imaging follow-up in 5-7 days would be helpful to
differentiate a stable peritoneal inclusion cyst from a extra
uterine gestational sac.
